# Patient Record
Sex: Female | Born: 1935 | Race: White | Hispanic: No | Smoking: Former smoker
Health system: Southern US, Community
[De-identification: ages and names within clinical notes are randomized; demographics above are authoritative.]

## PROBLEM LIST (undated history)

## (undated) DIAGNOSIS — F0390 Unspecified dementia without behavioral disturbance: Secondary | ICD-10-CM

## (undated) DIAGNOSIS — I10 Essential (primary) hypertension: Secondary | ICD-10-CM

## (undated) DIAGNOSIS — E7800 Pure hypercholesterolemia, unspecified: Secondary | ICD-10-CM

## (undated) HISTORY — PX: CATARACT EXTRACTION: SUR2

---

## 1959-04-30 HISTORY — PX: TOTAL ABDOMINAL HYSTERECTOMY: SHX209

## 2004-08-26 ENCOUNTER — Ambulatory Visit: Payer: Self-pay | Admitting: *Deleted

## 2004-09-02 ENCOUNTER — Ambulatory Visit: Payer: Self-pay | Admitting: Family Medicine

## 2004-11-22 ENCOUNTER — Ambulatory Visit: Payer: Self-pay | Admitting: Family Medicine

## 2005-03-02 ENCOUNTER — Ambulatory Visit: Payer: Self-pay | Admitting: Family Medicine

## 2005-06-07 ENCOUNTER — Ambulatory Visit: Payer: Self-pay | Admitting: Family Medicine

## 2005-10-17 ENCOUNTER — Ambulatory Visit: Payer: Self-pay | Admitting: Family Medicine

## 2006-02-15 ENCOUNTER — Ambulatory Visit: Payer: Self-pay | Admitting: Family Medicine

## 2006-04-28 ENCOUNTER — Ambulatory Visit (HOSPITAL_COMMUNITY): Admission: RE | Admit: 2006-04-28 | Discharge: 2006-04-28 | Payer: Self-pay | Admitting: Ophthalmology

## 2006-06-26 ENCOUNTER — Ambulatory Visit: Payer: Self-pay | Admitting: Family Medicine

## 2006-10-17 ENCOUNTER — Ambulatory Visit: Payer: Self-pay | Admitting: Family Medicine

## 2011-01-14 NOTE — Op Note (Signed)
NAMEVAUDINE, DUTAN                ACCOUNT NO.:  [id number]   MEDICAL RECORD NO.:  [id number]          PATIENT TYPE:  AMB   LOCATION:  DAY                           FACILITY:  [facility]   PHYSICIAN:  Susanne Greenhouse, MD       DATE OF BIRTH:  06-24-1924   DATE OF PROCEDURE:  04/28/2006  DATE OF DISCHARGE:  04/28/2006                                 OPERATIVE REPORT   PREOPERATIVE DIAGNOSIS:  Nuclear and cortical cataract, left eye.   POSTOPERATIVE DIAGNOSIS:  Nuclear and cortical cataract, left eye.   PROCEDURE:  Phacoemulsification with intraocular lens implantation, left  eye.   SURGEON:  Susanne Greenhouse, M.D.   ANESTHESIA:  Topical with monitored anesthesia care.   DESCRIPTION OF PROCEDURE:  In the preoperative area, dilating drops and 2%  viscous lidocaine were placed into the left eye.  The patient was then  brought to the operating room where the eye was prepped and draped.   A Supersharp blade was used to make a paracentesis port at the surgeon's 2  o'clock position.  The anterior chamber was filled with 1% nonpreserved  lidocaine solution followed by Amvisc Plus.  A 2.85-mm keratome blade was  used to make a clear corneal incision at the superotemporal limbus.  A  cystotome needle was used to create a continuous tear capsulotomy.  Hydrodissection was performed using balanced salt solution on a fine  cannula.  The lens nucleus was then removed using phacoemulsification with  the quadrant cracking technique.  The residual cortex was removed with the  irrigation and aspiration.  The capsular bag and anterior chamber were  refilled with Amvisc Plus.  A posterior chamber intraocular lens was placed  into the capsular bag using a Monarch lens injecting system.  The Amvisc  Plus was then removed from the capsular bag and anterior chamber with  irrigation and aspiration.  Stromal hydration of the main incision and  paracentesis ports were performed with balanced salt solution on a fine  cannula.  The wound was tested for leak which was negative.  There were no  operative complications.   The patient tolerated the procedure well and was returned to the recovery  area in satisfactory condition.   PROSTHETIC DEVICE:  Alcon AcrySof posterior chamber intraocular lens, model  SN60WF, power of 22.0, serial #[id number].           ______________________________  Susanne Greenhouse, MD     KEH/MEDQ  D:  05/29/2006  T:  05/30/2006  Job:  [id number]

## 2011-02-26 ENCOUNTER — Emergency Department (HOSPITAL_BASED_OUTPATIENT_CLINIC_OR_DEPARTMENT_OTHER)
Admission: EM | Admit: 2011-02-26 | Discharge: 2011-02-27 | Disposition: A | Discharge status: Home / Self Care | Payer: Medicare Other | Source: Home / Self Care | Attending: Emergency Medicine | Admitting: Emergency Medicine

## 2011-02-26 ENCOUNTER — Emergency Department (INDEPENDENT_AMBULATORY_CARE_PROVIDER_SITE_OTHER): Payer: Medicare Other

## 2011-02-26 DIAGNOSIS — F29 Unspecified psychosis not due to a substance or known physiological condition: Secondary | ICD-10-CM

## 2011-02-26 DIAGNOSIS — R5381 Other malaise: Secondary | ICD-10-CM

## 2011-02-26 DIAGNOSIS — R5383 Other fatigue: Secondary | ICD-10-CM

## 2011-02-26 LAB — DIFFERENTIAL
Basophils Absolute: 0 10*3/uL (ref 0.0–0.1)
Basophils Relative: 0 % (ref 0–1)
Eosinophils Absolute: 0 10*3/uL (ref 0.0–0.7)
Eosinophils Relative: 0 % (ref 0–5)
Monocytes Absolute: 0.8 10*3/uL (ref 0.1–1.0)
Neutro Abs: 15.5 10*3/uL — ABNORMAL HIGH (ref 1.7–7.7)

## 2011-02-26 LAB — URINALYSIS, ROUTINE W REFLEX MICROSCOPIC
Nitrite: NEGATIVE
Protein, ur: 30 mg/dL — AB
Urobilinogen, UA: 1 mg/dL (ref 0.0–1.0)

## 2011-02-26 LAB — CBC
Hemoglobin: 11.7 g/dL — ABNORMAL LOW (ref 12.0–15.0)
MCHC: 34.4 g/dL (ref 30.0–36.0)
Platelets: 209 10*3/uL (ref 150–400)
RDW: 12.7 % (ref 11.5–15.5)

## 2011-02-26 LAB — URINE MICROSCOPIC-ADD ON

## 2011-02-26 LAB — COMPREHENSIVE METABOLIC PANEL
AST: 24 U/L (ref 0–37)
Alkaline Phosphatase: 48 U/L (ref 39–117)
BUN: 34 mg/dL — ABNORMAL HIGH (ref 6–23)
CO2: 21 mEq/L (ref 19–32)
Chloride: 97 mEq/L (ref 96–112)
Creatinine, Ser: 1.3 mg/dL — ABNORMAL HIGH (ref 0.50–1.10)
GFR calc non Af Amer: 39 mL/min — ABNORMAL LOW (ref >60)
Potassium: 2.5 mEq/L — CL (ref 3.5–5.1)
Total Bilirubin: 1 mg/dL (ref 0.3–1.2)

## 2011-02-26 LAB — PROCALCITONIN: Procalcitonin: 0.34 ng/mL

## 2011-02-27 ENCOUNTER — Inpatient Hospital Stay (HOSPITAL_COMMUNITY)
Admission: EM | Admit: 2011-02-27 | Discharge: 2011-03-01 | DRG: 923 | Disposition: A | Discharge status: Home Health | Payer: Medicare Other | Source: Other Acute Inpatient Hospital | Attending: Family Medicine | Admitting: Family Medicine

## 2011-02-27 ENCOUNTER — Inpatient Hospital Stay (HOSPITAL_COMMUNITY): Payer: Medicare Other

## 2011-02-27 DIAGNOSIS — Z23 Encounter for immunization: Secondary | ICD-10-CM

## 2011-02-27 DIAGNOSIS — R404 Transient alteration of awareness: Secondary | ICD-10-CM | POA: Diagnosis present

## 2011-02-27 DIAGNOSIS — T6701XA Heatstroke and sunstroke, initial encounter: Principal | ICD-10-CM | POA: Diagnosis present

## 2011-02-27 DIAGNOSIS — N289 Disorder of kidney and ureter, unspecified: Secondary | ICD-10-CM | POA: Diagnosis present

## 2011-02-27 DIAGNOSIS — D72829 Elevated white blood cell count, unspecified: Secondary | ICD-10-CM | POA: Diagnosis present

## 2011-02-27 DIAGNOSIS — F0390 Unspecified dementia without behavioral disturbance: Secondary | ICD-10-CM | POA: Diagnosis present

## 2011-02-27 DIAGNOSIS — E785 Hyperlipidemia, unspecified: Secondary | ICD-10-CM | POA: Diagnosis present

## 2011-02-27 DIAGNOSIS — E876 Hypokalemia: Secondary | ICD-10-CM | POA: Diagnosis present

## 2011-02-27 DIAGNOSIS — N390 Urinary tract infection, site not specified: Secondary | ICD-10-CM | POA: Diagnosis present

## 2011-02-27 DIAGNOSIS — I4891 Unspecified atrial fibrillation: Secondary | ICD-10-CM | POA: Diagnosis present

## 2011-02-27 DIAGNOSIS — N179 Acute kidney failure, unspecified: Secondary | ICD-10-CM | POA: Diagnosis present

## 2011-02-27 DIAGNOSIS — Z7982 Long term (current) use of aspirin: Secondary | ICD-10-CM

## 2011-02-27 DIAGNOSIS — I10 Essential (primary) hypertension: Secondary | ICD-10-CM | POA: Diagnosis present

## 2011-02-27 DIAGNOSIS — X30XXXA Exposure to excessive natural heat, initial encounter: Secondary | ICD-10-CM | POA: Diagnosis present

## 2011-02-27 DIAGNOSIS — E860 Dehydration: Secondary | ICD-10-CM | POA: Diagnosis present

## 2011-02-27 LAB — COMPREHENSIVE METABOLIC PANEL
AST: 37 U/L (ref 0–37)
Albumin: 3.1 g/dL — ABNORMAL LOW (ref 3.5–5.2)
BUN: 27 mg/dL — ABNORMAL HIGH (ref 6–23)
Calcium: 8.2 mg/dL — ABNORMAL LOW (ref 8.4–10.5)
Chloride: 106 mEq/L (ref 96–112)
Creatinine, Ser: 0.87 mg/dL (ref 0.50–1.10)
Total Bilirubin: 0.9 mg/dL (ref 0.3–1.2)
Total Protein: 5.3 g/dL — ABNORMAL LOW (ref 6.0–8.3)

## 2011-02-27 LAB — URINALYSIS, ROUTINE W REFLEX MICROSCOPIC
Hgb urine dipstick: NEGATIVE
Leukocytes, UA: NEGATIVE
Nitrite: NEGATIVE
Protein, ur: NEGATIVE mg/dL
Specific Gravity, Urine: 1.004 — ABNORMAL LOW (ref 1.005–1.030)
Urobilinogen, UA: 0.2 mg/dL (ref 0.0–1.0)

## 2011-02-27 LAB — CBC
HCT: 30.2 % — ABNORMAL LOW (ref 36.0–46.0)
MCHC: 34.4 g/dL (ref 30.0–36.0)
MCV: 87 fL (ref 78.0–100.0)
Platelets: 164 10*3/uL (ref 150–400)
RDW: 12.9 % (ref 11.5–15.5)
WBC: 8.6 10*3/uL (ref 4.0–10.5)

## 2011-02-27 LAB — DIFFERENTIAL
Eosinophils Absolute: 0 10*3/uL (ref 0.0–0.7)
Eosinophils Relative: 0 % (ref 0–5)
Lymphs Abs: 1.5 10*3/uL (ref 0.7–4.0)
Monocytes Absolute: 1 10*3/uL (ref 0.1–1.0)

## 2011-02-27 LAB — MAGNESIUM: Magnesium: 1.7 mg/dL (ref 1.5–2.5)

## 2011-02-27 LAB — TSH: TSH: 0.424 u[IU]/mL (ref 0.350–4.500)

## 2011-02-27 LAB — CK: Total CK: 905 U/L — ABNORMAL HIGH (ref 7–177)

## 2011-02-27 LAB — MRSA PCR SCREENING: MRSA by PCR: NEGATIVE

## 2011-02-28 LAB — URINE CULTURE
Culture  Setup Time: 201207012053
Culture: NO GROWTH

## 2011-02-28 LAB — CBC
HCT: 37.2 % (ref 36.0–46.0)
MCHC: 35.2 g/dL (ref 30.0–36.0)
Platelets: 190 10*3/uL (ref 150–400)
RDW: 12.8 % (ref 11.5–15.5)
WBC: 13.5 10*3/uL — ABNORMAL HIGH (ref 4.0–10.5)

## 2011-02-28 LAB — BASIC METABOLIC PANEL
BUN: 13 mg/dL (ref 6–23)
Chloride: 107 mEq/L (ref 96–112)
Creatinine, Ser: 0.57 mg/dL (ref 0.50–1.10)
GFR calc Af Amer: >60 mL/min (ref >60)
GFR calc non Af Amer: >60 mL/min (ref >60)
Potassium: 3 mEq/L — ABNORMAL LOW (ref 3.5–5.1)

## 2011-02-28 LAB — IRON AND TIBC: Iron: 18 ug/dL — ABNORMAL LOW (ref 42–135)

## 2011-02-28 LAB — CK: Total CK: 928 U/L — ABNORMAL HIGH (ref 7–177)

## 2011-02-28 LAB — MYOGLOBIN, SERUM: Myoglobin: 618 ng/mL — ABNORMAL HIGH (ref <111)

## 2011-02-28 LAB — VITAMIN B12: Vitamin B-12: 448 pg/mL (ref 211–911)

## 2011-03-01 ENCOUNTER — Inpatient Hospital Stay (HOSPITAL_COMMUNITY): Payer: Medicare Other

## 2011-03-01 LAB — CBC
MCHC: 35 g/dL (ref 30.0–36.0)
Platelets: 189 10*3/uL (ref 150–400)
RDW: 13.2 % (ref 11.5–15.5)

## 2011-03-01 LAB — CK: Total CK: 536 U/L — ABNORMAL HIGH (ref 7–177)

## 2011-03-01 LAB — BASIC METABOLIC PANEL
Calcium: 8.5 mg/dL (ref 8.4–10.5)
GFR calc non Af Amer: >60 mL/min (ref >60)
Sodium: 136 mEq/L (ref 135–145)

## 2011-03-01 LAB — AMMONIA: Ammonia: 15 umol/L (ref 11–60)

## 2011-03-01 LAB — RPR: RPR Ser Ql: NONREACTIVE

## 2011-03-05 LAB — CULTURE, BLOOD (ROUTINE X 2): Culture: NO GROWTH

## 2011-03-10 NOTE — Discharge Summary (Signed)
NAMEMarland Kitchen  NARDOS, PUTNAM NO.:  [id number]  MEDICAL RECORD NO.:  [id number]  LOCATION:  2035                         FACILITY:  [facility]  PHYSICIAN:  Brendia Sacks, MD    DATE OF BIRTH:  15-Aug-1924  DATE OF ADMISSION:  02/27/2011 DATE OF DISCHARGE:  03/01/2011                              DISCHARGE SUMMARY   PRIMARY CARE PHYSICIAN:  Delaney Meigs, MD  DISPOSITION:  Home with Home Health, physical and occupational therapy, as well as home health RN.  DISCHARGE DIAGNOSES: 1. Hyperthermia/heat exhaustion. 2. Acute delirium, improving. 3. Hypokalemia, resolved. 4. Acute renal failure, resolved. 5. Possible brief atrial fibrillation, resolved.  HISTORY OF PRESENT ILLNESS:  This is an 77 year old woman who presented to the emergency room after her granddaughter found her unconscious. The patient's air-conditioning had been out for 2 or 3 days and the granddaughter went to check on her and found her unconscious.  She was taken to [facility] where she was found to have a temperature of 104.5 degrees rectally.  She was given IV fluids and cooled down.  Her hypokalemia was repleted and she was admitted to [facility] for further evaluation and treatment.  HOSPITAL COURSE: 1. Hyperthermia.  The patient was cool, was brought to [facility].     She has been treated with supportive care.  Temperature has     normalized and she is much improved at this point.  The underlying     etiology would be the extreme heat outside with a broken air     conditioning unit. 2. Acute delirium.  This is improving.  It is likely secondary to mild     underlying dementia.  By history, the patient does get confused     when her environment changes.  She has been seen by Physical and     Occupational Therapy and is felt to be able to go home with 24-hour     assistance.  I have discussed the case with her granddaughter by     telephone today whose name is [employer] and  she will be able to     stay with her grandmother 24 x 7 for at least the next few days to     make she gets acclimated. 3. Hypokalemia.  This was repleted. 4. Acute renal failure secondary to the heat and insensible losses.     This has resolved with IV fluids.  Note that the patient's total CK     is trending downwards towards normal. 5. Possible brief atrial fibrillation.  This is documented in the     history and physical and in the emergency room department note,     however, I can find no EKG with atrial fibrillation on it.  EKG     that is available for review is dated February 27, 2011 at 6:24 a.m.     which shows sinus rhythm with PAC.  No acute changes are seen     there.  Regardless, even if the patient did demonstrate atrial     fibrillation, this would be in the context of hyperthermia,     dehydration, and presumably cardiac  irritability.  At this point, I     would not recommend any further evaluation and as correction of her     hypothermia has resolved her condition.  I would not put her on any     rate control agents at this point.  She is not on aspirin.  Low-     dose baby aspirin would be reasonable in this patient in general.     This can certainly be started on discharge and I would defer     continuing it to her primary care physician.  At this point,     because of her recent renal failure, I have put her     hydrochlorothiazide and Lipitor on hold.  The hydrochlorothiazide     likely contributed to her renal failure.  CONSULTATIONS:  None.  PROCEDURES:  None.  IMAGING: 1. Chest x-ray, February 26, 2011:  Minimal bronchitic and emphysematous     changes.  No definite acute process. 2. CT of the head, February 27, 2011:  Mild scattered white matter disease     likely representing small vessel ischemic change.  No acute     intracranial finding demonstrated. 3. Chest x-ray, March 01, 2011:  Emphysematous and bronchitic changes     with minimal bibasilar  atelectasis.  MICROBIOLOGY: 1. Blood cultures x2, March 28, 2011, no growth to date. 2. Urine culture, February 27, 2011, no growth until the final.  ANCILLARY STUDIES:  EKG as described above.  PERTINENT LABORATORY STUDIES:  Admission CBC notable for a leukocytosis of 16.9 which decreased to 11.0 on discharge.  Hemoglobin, hematocrit, and platelet count within normal limits.  Basic metabolic panel notable for potassium of 2.5 on admission and 4.7 on discharge.  Creatinine on admission 1.309, on discharge 0.61.  Total creatinine kinase 905, on discharge 536.  Serum ammonia within normal limits.  TSH within normal limits.  PHYSICAL EXAMINATION ON DISCHARGE:  GENERAL:  The patient feels well. CARDIOVASCULAR:  Regular rate and rhythm.  No murmur, rub, or gallop. RESPIRATORY:  Clear to auscultation bilaterally.  No wheezes, rales, or rhonchi.  Normal respiratory effort.  No lower extremity edema. NEUROLOGIC:  Cranial nerves II-XII do appear to be intact.  Speech is fluent and clear.  She is alert and oriented to herself and to her location.  The history she relates does appear to be consistent with what the granddaughter has reported to me.  Tone and strength in the upper and lower extremities is 5/5 and symmetric.  No gross neurologic deficits.  DISCHARGE INSTRUCTIONS:  The patient will be discharged home today with 24-hour assistance provided by her granddaughter.  She will be discharged home with home health, physical, occupational therapy, and home health nurse as needed.  ACTIVITY:  Increase slowly.  Walk with assistance.  DIET:  Unrestricted.  FOLLOWUP:  With Dr. Joette Catching in 1 week.  DISCHARGE MEDICATIONS:  Aspirin 81 mg p.o. daily.  DISCONTINUE THE FOLLOWING MEDICATIONS:  Lipitor and hydrochlorothiazide. These could be resumed in the outpatient setting as per Dr. Joyce Copa discretion.  Time coordinating discharge is 25 minutes.     Brendia Sacks,  MD     DG/MEDQ  D:  03/01/2011  T:  03/02/2011  Job:  [id number]  cc:   Delaney Meigs, M.D.  Electronically Signed by Brendia Sacks  on 03/10/2011 11:21:27 AM

## 2011-03-14 NOTE — H&P (Signed)
Brandi Ramirez, Brandi Ramirez                ACCOUNT NO.:  [id number]  MEDICAL RECORD NO.:  [id number]  LOCATION:  3303                         FACILITY:  [facility]  PHYSICIAN:  Edmundo Tedesco, DO         DATE OF BIRTH:  03-03-1924  DATE OF ADMISSION:  02/27/2011 DATE OF DISCHARGE:                             HISTORY & PHYSICAL   CHIEF COMPLAINT:  Heat exhaustion.  HISTORY OF PRESENT ILLNESS:  The patient is an 77 year old female who states that her air conditioning has been out for 2-3 days.  She did have a repair person come by and they said that they would fix it in a couple of days.  She states that she did not feel especially hot but apparently her granddaughter tried to reach her this afternoon and was unable to reach her so she came by to see her and the patient was not conscious.  She was taken to [facility] and was found to have a temperature of 104.5 rectally.  There she was given IV fluids.  She was cooled down.  Her potassium was 2.5.  She was given 6 rounds of IV potassium and at the time that she left her temperature was 97.8. Here, the patient is awake, alert and states she feels quite a bit better.  PAST MEDICAL HISTORY:  Significant only for hypertension.  PAST SURGICAL HISTORY:  None.  MEDICATIONS:  The patient states she takes Lipitor and one other medication that she cannot remember the name of.  ALLERGIES:  NKDA.  FAMILY HISTORY:  She states that she has a large family and has little bit of everything in her family which includes coronary artery disease and cancer.  REVIEW OF SYSTEMS:  CONSTITUTIONAL:  Negative for fever, negative for chills.  Positive for weakness, positive for fatigue.  CNS:  Positive for loss of consciousness secondary to heat  exhaustion.  Negative for seizure, negative for  specific limb weakness.  ENT:  No nasal congestion or throat pain.  No coryza.  CARDIOVASCULAR:  No chest pain, no palpitations, no orthopnea.  RESPIRATORY:  No  cough, no shortness of breath, no wheezing.  GASTROINTESTINAL: No nausea, no vomiting, no constipation, no diarrhea.  GENITOURINARY:  No dysuria, no hematuria, no urinary frequency.  RENAL:  No flank pain.  No swelling.  No pruritus. SKIN:  No rashes or sores or lesions.  HEMATOLOGICAL:  No easy bruising, no purpura, no clots.  LYMPH:  No lymphadenopathy.  No painful nodes or specific lymph swelling.  PSYCHIATRIC:  No anxiety.  No depression.  No insomnia.  SOCIAL HISTORY:  No tobacco and alcohol.  No recreational drug use.  The patient lives at home alone but has family nearby.  PHYSICAL EXAMINATION:  VITAL SIGNS:  Temperature is 97.8, pulse 92, respiratory rate 19, blood pressure 90/47, O2 sat 100% on 2 liters of O2. GENERAL:  The patient is awake, alert and oriented x3.  She is a well kept, well-nourished elderly female who does seem to perseverate a little bit in her speech. HEENT:  Eyes, pupils equal, round and reactive to light and accommodation.  External ocular movements bilaterally intact.  Sclerae are nonicteric,  noninjected.  Mouth, oral mucosa dry.  No lesions.  No sores.  Pharynx clear.  No erythema or exudate. NECK:  Negative for JVD.  Negative for thyromegaly.  Negative lymphadenopathy. HEART:  Regular rate and rhythm at 90 beats per minute without murmur, ectopy, or gallops.  No lateral PMI.  No thrills. LUNGS:  Clear to auscultation bilaterally without wheezes, rales or rhonchi.  No increased work of breathing.  No tactile fremitus. ABDOMEN:  Soft, nontender, nondistended.  Positive bowel sounds.  No hepatosplenomegaly.  No hernias palpated. EXTREMITIES.  Negative for cyanosis, clubbing or edema.  Positive for slightly diminished dorsalis pedis and popliteal pulses bilaterally.  No carotid bruits bilaterally.  NEUROLOGICAL:  Cranial nerves II-XII grossly intact.  The patient is moving all extremities.  Motor and sensitivity is intact.  LABORATORY STUDIES:  WBC is  16.9, hemoglobin 11.7, hematocrit 34, platelets 209.  She has 92% neutrophils.  UA is weakly positive for UTI. Sodium 134, potassium 2.5, chloride 97, CO2 21, BUN 34, creatinine is 1.30, glucose 157.  Procalcitonin 0.34, lactic acid is 0.9.  Chest x-ray shows minimal bronchitic and emphysematous changes, otherwise, no acute disease.  EKG showed atrial fibrillation with nonspecific ST and T-wave changes, rate was 106.  She had no prior EKG for comparison.  ASSESSMENT: 1. Heat exhaustion. 2. Hypokalemia.  The patient was given 60 mEq of potassium chloride at     [facility]. 3. Dehydration. 4. Acute renal failure. 5. Atrial fibrillation, unknown if this is an old problem or not.  The     patient has a CHAD2 score of 1 making her stroke risk from atrial     fibrillation of 1.9-2.8%.  Recommendation is for an aspirin a day.     We will hydrate the patient and monitor her rate.  At this point     with her hypotension and dehydrated state, I would not want to     start her on a rate controlling agent as she may not need it and it     may actually make her blood pressure too low. 6. Mild urinary tract infection. 7. Hyperlipidemia.8. Likely mild dementia.  PLAN: 1. IV fluids. 2. Recheck a potassium. 3. P.o. Bactrim. 4. Continue Lipitor. 5. Aspirin daily and recheck an EKG in the morning and monitor Is and     Os and electrolyte.  I have spent 48 minutes on this admission.          ______________________________ Fran Lowes, DO     AS/MEDQ  D:  02/27/2011  T:  02/27/2011  Job:  [id number]  cc:   Delaney Meigs, M.D.  Electronically Signed by Fran Lowes DO on 03/14/2011 01:46:17 PM

## 2011-07-13 ENCOUNTER — Emergency Department (HOSPITAL_BASED_OUTPATIENT_CLINIC_OR_DEPARTMENT_OTHER)
Admission: EM | Admit: 2011-07-13 | Discharge: 2011-07-14 | Disposition: A | Discharge status: Home / Self Care | Payer: Medicare Other | Attending: Emergency Medicine | Admitting: Emergency Medicine

## 2011-07-13 ENCOUNTER — Emergency Department (INDEPENDENT_AMBULATORY_CARE_PROVIDER_SITE_OTHER): Payer: Medicare Other

## 2011-07-13 ENCOUNTER — Encounter: Payer: Self-pay | Admitting: *Deleted

## 2011-07-13 DIAGNOSIS — M25469 Effusion, unspecified knee: Secondary | ICD-10-CM

## 2011-07-13 DIAGNOSIS — M25476 Effusion, unspecified foot: Secondary | ICD-10-CM | POA: Insufficient documentation

## 2011-07-13 DIAGNOSIS — W08XXXA Fall from other furniture, initial encounter: Secondary | ICD-10-CM | POA: Insufficient documentation

## 2011-07-13 DIAGNOSIS — W19XXXA Unspecified fall, initial encounter: Secondary | ICD-10-CM

## 2011-07-13 DIAGNOSIS — M1710 Unilateral primary osteoarthritis, unspecified knee: Secondary | ICD-10-CM

## 2011-07-13 DIAGNOSIS — M25569 Pain in unspecified knee: Secondary | ICD-10-CM | POA: Insufficient documentation

## 2011-07-13 DIAGNOSIS — I10 Essential (primary) hypertension: Secondary | ICD-10-CM | POA: Insufficient documentation

## 2011-07-13 DIAGNOSIS — M25473 Effusion, unspecified ankle: Secondary | ICD-10-CM | POA: Insufficient documentation

## 2011-07-13 DIAGNOSIS — M1120 Other chondrocalcinosis, unspecified site: Secondary | ICD-10-CM | POA: Insufficient documentation

## 2011-07-13 HISTORY — DX: Essential (primary) hypertension: I10

## 2011-07-13 NOTE — ED Notes (Signed)
Family came to visit pt this evening and found her in floor. Pt states that she didn't fall but was just sitting there. Now c/o left knee pain with minimal swelling.

## 2011-07-14 NOTE — ED Provider Notes (Signed)
History     CSN: [id number] Arrival date & time: 07/13/2011 10:31 PM   First MD Initiated Contact with Patient 07/13/11 2238      Chief Complaint  Patient presents with  . Fall  . Knee Pain    (Consider location/radiation/quality/duration/timing/severity/associated sxs/prior treatment) Patient is a 77 y.o. female presenting with fall and knee pain. The history is provided by the patient and a relative.  Fall The accident occurred 1 to 2 hours ago. Incident: While sitting on the couch. She fell from a height of 1 to 2 ft. She landed on carpet. There was no blood loss. Point of impact: buttocks. Pain location: None. The pain is at a severity of 0/10. The pain is mild. She was not ambulatory at the scene. There was no entrapment after the fall. There was no alcohol use involved in the accident. Pertinent negatives include no visual change, no fever, no numbness, no abdominal pain, no bowel incontinence, no nausea, no vomiting, no hematuria, no headaches, no hearing loss, no loss of consciousness and no tingling. Exacerbated by: Nothing. She has tried nothing for the symptoms.  Knee Pain Pertinent negatives include no chest pain, no abdominal pain, no headaches and no shortness of breath.   Patient lives alone. Found sitting down on the floor next to the couch where she had slid off and was able to get up. She has very problems in many people looking in on her, but does not have any formal home health at home. Family noticed her left knee was swollen and brought in for evaluation. Patient denies any known trauma or injury to her knee. No fevers or abrasions or lacerations. No head injury or neck pain. No extremity injuries otherwise. Patient uses a walker and a cane at home Past Medical History  Diagnosis Date  . Hypertension     No past surgical history on file.  No family history on file.  History  Substance Use Topics  . Smoking status: Not on file  . Smokeless tobacco: Not on file    . Alcohol Use: No    OB History    Grav Para Term Preterm Abortions TAB SAB Ect Mult Living                  Review of Systems  Constitutional: Negative for fever and chills.  HENT: Negative for neck pain and neck stiffness.   Eyes: Negative for pain.  Respiratory: Negative for shortness of breath.   Cardiovascular: Negative for chest pain and palpitations.  Gastrointestinal: Negative for nausea, vomiting, abdominal pain and bowel incontinence.  Genitourinary: Negative for dysuria and hematuria.  Musculoskeletal: Negative for back pain.  Skin: Negative for rash.  Neurological: Negative for tingling, loss of consciousness, numbness and headaches.  All other systems reviewed and are negative.    Allergies  Review of patient's allergies indicates no known allergies.  Home Medications   Current Outpatient Rx  Name Route Sig Dispense Refill  . ASPIRIN EC 81 MG PO TBEC Oral Take 81 mg by mouth daily.      . ATORVASTATIN CALCIUM 10 MG PO TABS Oral Take 10 mg by mouth daily.      Marland Kitchen HYDROCHLOROTHIAZIDE 12.5 MG PO CAPS Oral Take 12.5 mg by mouth daily.        BP 123/72  Pulse 96  Temp(Src) 97.9 F (36.6 C) (Oral)  Resp 20  SpO2 99%  Physical Exam  Constitutional: She appears well-developed and well-nourished.  HENT:  Head: Normocephalic  and atraumatic.  Eyes: Conjunctivae and EOM are normal. Pupils are equal, round, and reactive to light.  Neck: Full passive range of motion without pain. Neck supple. No thyromegaly present.       No meningismus  Cardiovascular: Normal rate, regular rhythm, S1 normal, S2 normal and intact distal pulses.   Pulmonary/Chest: Effort normal and breath sounds normal.  Abdominal: Soft. Bowel sounds are normal. There is no tenderness. There is no CVA tenderness.  Musculoskeletal: Normal range of motion.       Mild swelling over left knee. No erythema or increased warmth. No calf tenderness or cords. Negative Homans sign. Distal neurovascular  intact.  Neurological: She is alert. She has normal strength and normal reflexes. No cranial nerve deficit or sensory deficit. She displays a negative Romberg sign. GCS eye subscore is 4. GCS verbal subscore is 5. GCS motor subscore is 6.       Normal Gait with assistance. Alert and oriented to self only. This is reported as baseline per family  Skin: Skin is warm and dry. No rash noted. No cyanosis. Nails show no clubbing.  Psychiatric: She has a normal mood and affect. Her speech is normal and behavior is normal.    ED Course  Procedures (including critical care time)  Labs Reviewed - No data to display Dg Knee Complete 4 Views Left  07/13/2011  *RADIOLOGY REPORT*  Clinical Data: Left knee pain; limited range of motion.  LEFT KNEE - COMPLETE 4+ VIEW  Comparison: None.  Findings: There is no evidence of fracture or dislocation.  Joint spaces are grossly preserved.  Chondrocalcinosis is noted. Marginal osteophytes are noted arising at all three compartments.  A small to moderate knee joint effusion is noted; associated calcification is seen above the patella.  An apparent loose body is noted posteriorly at the joint space.  Scattered vascular calcifications are noted.  IMPRESSION:  1.  No evidence of fracture or dislocation. 2.  Osteoarthritis noted at the left knee. 3.  Chondrocalcinosis seen. 4.  Small to moderate knee joint effusion, with associated calcification superior to the patella; loose body noted posteriorly at the joint space. 5.  Scattered vascular calcifications seen.  Original Report Authenticated By: Tonia Ghent, M.D.    After evaluation, consult case management to have home health assessment done. Family willing to stay with her tonight and agreeable to plan. They have already initiated the process of higher level of care.   MDM   No trauma by exam. No clinical infection. X-ray obtained and reviewed as above. Patient ambulates and denies any pain or trauma. She is baseline per  family and stable for discharge home       Sunnie Nielsen, MD 07/14/11 [phone]

## 2011-07-14 NOTE — ED Notes (Signed)
Call placed to SW and nurse case manager for home health assistance per MD order, awaiting return call.

## 2011-07-14 NOTE — ED Notes (Signed)
Pt assisted to bathroom. Pt was incontinent stool. Pt cleaned and fresh diaper placed.

## 2011-07-14 NOTE — ED Notes (Signed)
MD in to evaluate pt now.

## 2011-07-14 NOTE — ED Notes (Signed)
Spoke with case manager on call and faxed appropriate paper work to [facility] for follow up with pt.

## 2011-07-14 NOTE — ED Notes (Signed)
Pt ambulates with assistance. Pt states she has a cane at home.

## 2012-07-25 IMAGING — CR DG CHEST 1V PORT
1 series · 1 of 1 positions shown · non-contrast
Comparison: Portable exam 3718 hours without priors for comparison

CLINICAL DATA: Heat exhaustion, lethargy, confusion, dementia

PORTABLE CHEST - 1 VIEW

[view not recorded]
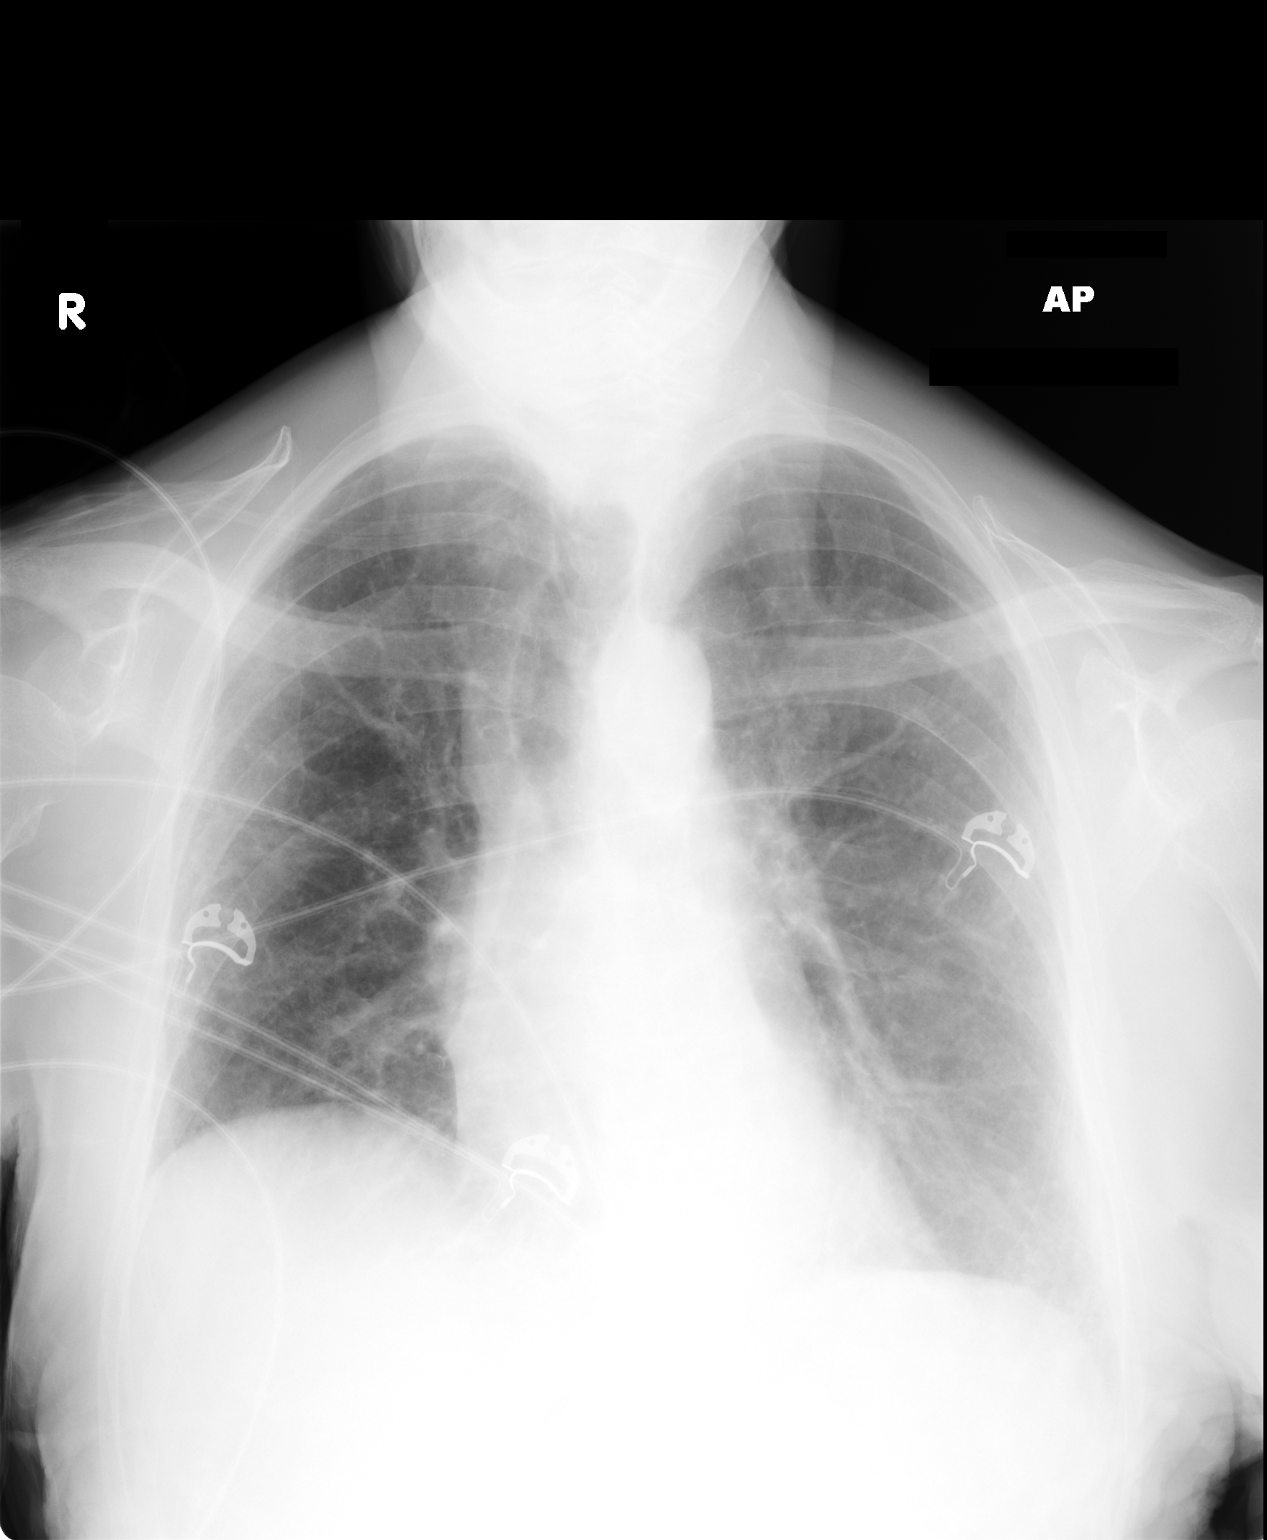

[1 of 1 positions shown; findings below may reference images not displayed]

FINDINGS: Normal heart size, mediastinal contours, and pulmonary vascularity.
Minimal hazy asymmetric density in left hemithorax, question
asymmetric emphysematous changes.
Minimal peribronchial thickening.
No gross infiltrate or effusion identified.
No pneumothorax.
Slightly rotated exam.
IMPRESSION: Minimal bronchitic and suspect emphysematous changes.
No definite acute process.

## 2013-08-26 ENCOUNTER — Inpatient Hospital Stay (HOSPITAL_COMMUNITY): Payer: Medicare Other

## 2013-08-26 ENCOUNTER — Emergency Department (HOSPITAL_COMMUNITY): Payer: Medicare Other

## 2013-08-26 ENCOUNTER — Encounter (HOSPITAL_COMMUNITY): Payer: Self-pay | Admitting: Emergency Medicine

## 2013-08-26 ENCOUNTER — Inpatient Hospital Stay (HOSPITAL_COMMUNITY)
Admission: EM | Admit: 2013-08-26 | Discharge: 2013-08-31 | DRG: 558 | Disposition: A | Discharge status: Skilled Nursing Facility | Payer: Medicare Other | Attending: Internal Medicine | Admitting: Internal Medicine

## 2013-08-26 DIAGNOSIS — R05 Cough: Secondary | ICD-10-CM | POA: Diagnosis present

## 2013-08-26 DIAGNOSIS — F0391 Unspecified dementia with behavioral disturbance: Secondary | ICD-10-CM | POA: Diagnosis present

## 2013-08-26 DIAGNOSIS — I10 Essential (primary) hypertension: Secondary | ICD-10-CM | POA: Diagnosis present

## 2013-08-26 DIAGNOSIS — F03918 Unspecified dementia, unspecified severity, with other behavioral disturbance: Secondary | ICD-10-CM

## 2013-08-26 DIAGNOSIS — Z79899 Other long term (current) drug therapy: Secondary | ICD-10-CM

## 2013-08-26 DIAGNOSIS — I4891 Unspecified atrial fibrillation: Secondary | ICD-10-CM

## 2013-08-26 DIAGNOSIS — M6282 Rhabdomyolysis: Principal | ICD-10-CM

## 2013-08-26 DIAGNOSIS — R058 Other specified cough: Secondary | ICD-10-CM

## 2013-08-26 DIAGNOSIS — W19XXXA Unspecified fall, initial encounter: Secondary | ICD-10-CM

## 2013-08-26 HISTORY — DX: Unspecified dementia, unspecified severity, without behavioral disturbance, psychotic disturbance, mood disturbance, and anxiety: F03.90

## 2013-08-26 HISTORY — DX: Pure hypercholesterolemia, unspecified: E78.00

## 2013-08-26 LAB — CK TOTAL AND CKMB (NOT AT ARMC)
Relative Index: 1.3 (ref 0.0–2.5)
Total CK: 957 U/L — ABNORMAL HIGH (ref 7–177)

## 2013-08-26 LAB — URINALYSIS, ROUTINE W REFLEX MICROSCOPIC
Bilirubin Urine: NEGATIVE
Hgb urine dipstick: NEGATIVE
Ketones, ur: 15 mg/dL — AB

## 2013-08-26 LAB — COMPREHENSIVE METABOLIC PANEL
Albumin: 3.6 g/dL (ref 3.5–5.2)
Alkaline Phosphatase: 79 U/L (ref 39–117)
BUN: 25 mg/dL — ABNORMAL HIGH (ref 6–23)
Calcium: 9.5 mg/dL (ref 8.4–10.5)
Potassium: 3.6 mEq/L (ref 3.5–5.1)
Sodium: 139 mEq/L (ref 135–145)
Total Protein: 7.2 g/dL (ref 6.0–8.3)

## 2013-08-26 LAB — POCT I-STAT TROPONIN I

## 2013-08-26 LAB — URINE MICROSCOPIC-ADD ON

## 2013-08-26 LAB — CBC
MCHC: 34 g/dL (ref 30.0–36.0)
RDW: 13.8 % (ref 11.5–15.5)

## 2013-08-26 MED ORDER — SODIUM CHLORIDE 0.9 % IV SOLN
INTRAVENOUS | Status: DC
  Administered 2013-08-27 (×4): via INTRAVENOUS

## 2013-08-26 MED ORDER — ONDANSETRON HCL 4 MG/2ML IJ SOLN: 4.0000 mg | Freq: Four times a day (QID) | INTRAMUSCULAR | Status: DC | PRN

## 2013-08-26 MED ORDER — ACETAMINOPHEN 650 MG RE SUPP: 650.0000 mg | Freq: Four times a day (QID) | RECTAL | Status: DC | PRN

## 2013-08-26 MED ORDER — ACETAMINOPHEN 325 MG PO TABS: 650.0000 mg | ORAL_TABLET | Freq: Four times a day (QID) | ORAL | Status: DC | PRN

## 2013-08-26 MED ORDER — ONDANSETRON HCL 4 MG PO TABS
4.0000 mg | ORAL_TABLET | Freq: Four times a day (QID) | ORAL | Status: DC | PRN
Start: 2013-08-26 — End: 2013-08-31

## 2013-08-26 MED ORDER — HEPARIN SODIUM (PORCINE) 5000 UNIT/ML IJ SOLN
5000.0000 [IU] | Freq: Three times a day (TID) | INTRAMUSCULAR | Status: DC
  Administered 2013-08-27 – 2013-08-31 (×14): 5000 [IU] via SUBCUTANEOUS
  Filled 2013-08-26 (×17): qty 1

## 2013-08-26 MED ORDER — SODIUM CHLORIDE 0.9 % IV BOLUS (SEPSIS)
1000.0000 mL | Freq: Once | INTRAVENOUS | Status: AC
  Administered 2013-08-26: 1000 mL via INTRAVENOUS

## 2013-08-26 MED ORDER — SODIUM CHLORIDE 0.9 % IJ SOLN
3.0000 mL | Freq: Two times a day (BID) | INTRAMUSCULAR | Status: DC
  Administered 2013-08-28 – 2013-08-29 (×2): 3 mL via INTRAVENOUS

## 2013-08-26 MED ORDER — SODIUM CHLORIDE 0.9 % IV SOLN: INTRAVENOUS | Status: DC

## 2013-08-26 NOTE — ED Notes (Signed)
Pt's family members at bedside. Offered case management consult r/t pt living alone (pt lives directly next to family and is checked on daily). Family states would like to speak with Case Manager. Burna Mortimer RN made aware and states will talk to pt and family.

## 2013-08-26 NOTE — ED Notes (Signed)
Dr. Jacubowitz at bedside 

## 2013-08-26 NOTE — ED Notes (Addendum)
Pt brought from home via [facility] with c/o fall with increased confusion. Pt was LSN by family at 2200 last night, pt was found by family at 1700 today on the floor with increased confusion (Pt has dementia and has some baseline confusion) Pt oriented to self. Pt c/o talbone pain and was immobilized upon arrival. Pt removed from LSB and head blocks by Dr. Ethelda Chick. Pt denies pain at this time.

## 2013-08-26 NOTE — ED Provider Notes (Signed)
CSN: [id number]     Arrival date & time 08/26/13  1837 History   First MD Initiated Contact with Patient 08/26/13 1936     Chief Complaint  Patient presents with  . Fall   (Consider location/radiation/quality/duration/timing/severity/associated sxs/prior Treatment) HPI Comments: Brandi Ramirez is a 79 year-old female with a past medical history of Dementia and HTN, presenting the Emergency Department with a chief complaint of unwitnessed fall.  The patient's daughter reports going over to her house for dinner, and finding the patient on the floor.  The patient lives alone, next door to family that check on her daily.  The pateint's daughter reports she found her laying on her back in her bedroom. Reports falling on to a carpeted floor, and reported pain in her left hip when trying to sit the patient up.  Level V caveat applies      The history is provided by the patient and a relative. No language interpreter was used.    Past Medical History  Diagnosis Date  . Hypertension   . High cholesterol     "took her off Lipitor ~ 6 months ago" (08/26/2013)  . Dementia    Past Surgical History  Procedure Laterality Date  . Total abdominal hysterectomy  1960's  . Cataract extraction Right ~ 2012   History reviewed. No pertinent family history. History  Substance Use Topics  . Smoking status: Former Smoker -- 0.50 packs/day    Types: Cigarettes  . Smokeless tobacco: Never Used     Comment: 08/26/2013 "social smoker; never inhaled; quit >45yr ago"  . Alcohol Use: No   OB History   Grav Para Term Preterm Abortions TAB SAB Ect Mult Living                 Review of Systems  Unable to perform ROS: Dementia    Allergies  Review of patient's allergies indicates no known allergies.  Home Medications   No current outpatient prescriptions on file. BP 121/72  Pulse 129  Temp(Src) 98.7 F (37.1 C) (Oral)  Resp 20  Ht 5\' 5"  (1.651 m)  Wt 117 lb 1 oz (53.1 kg)  BMI 19.48 kg/m2   SpO2 96% Physical Exam  Nursing note and vitals reviewed. Constitutional: She appears well-developed and well-nourished.  HENT:  Head: Normocephalic and atraumatic.  Eyes: Pupils are equal, round, and reactive to light.  Neck: Normal range of motion. Neck supple.  Cardiovascular: Normal rate.  An irregularly irregular rhythm present.  Pulmonary/Chest: Effort normal and breath sounds normal.  Abdominal: Soft. Normal appearance. There is no tenderness. There is no guarding.  Musculoskeletal:       Legs: LE: full ROM to bilateral knee and hips, no obvious deformity. Small abrasion to left knee.  Non-tender to palpation.   UE: Full ROM to elbows and shoulders, no obvious deformity. Bilateral elbows with abrasions.  Neurological: She is alert. She is disoriented. No sensory deficit. She exhibits normal muscle tone. Coordination normal.  Oriented to person and place not date/year.  Normal finger-nose finger.   Skin: Skin is warm and dry.  Psychiatric: Her speech is normal.    ED Course  Procedures (including critical care time) Labs Review Labs Reviewed  COMPREHENSIVE METABOLIC PANEL - Abnormal; Notable for the following:    BUN 25 (*)    GFR calc non Af Amer 79 (*)    All other components within normal limits  CK TOTAL AND CKMB - Abnormal; Notable for the following:  Total CK 957 (*)    CK, MB 12.1 (*)    All other components within normal limits  URINALYSIS, ROUTINE W REFLEX MICROSCOPIC - Abnormal; Notable for the following:    APPearance CLOUDY (*)    Ketones, ur 15 (*)    Leukocytes, UA SMALL (*)    All other components within normal limits  CBC  URINE MICROSCOPIC-ADD ON  COMPREHENSIVE METABOLIC PANEL  CBC  PROTIME-INR  CK  TROPONIN I  TROPONIN I  TROPONIN I  POCT I-STAT TROPONIN I   Imaging Review Dg Hip Complete Right  08/26/2013   CLINICAL DATA:  Patient fell.  Right hip pain.  EXAM: RIGHT HIP - COMPLETE 2+ VIEW  COMPARISON:  None.  FINDINGS: March degenerative  changes in both hips, greater on the right. There is sclerosis, subcortical cyst formation, loss of joint space, and hypertrophic changes on both sides of the joints bilaterally. Deformity of the superior and lateral femoral heads bilaterally. Changes probably represent marked osteoarthritis but superimposed avascular necrosis is not excluded. No evidence of acute fracture or dislocation of the pelvis or hips. Degenerative changes in the lower lumbar spine. Postoperative changes.  IMPRESSION: March degenerative changes in the hips. No acute displaced fractures.   Electronically Signed   By: Burman Nieves M.D.   On: 08/26/2013 21:24   Ct Head Wo Contrast  08/26/2013   CLINICAL DATA:  Post fall, now with increased confusion  EXAM: CT HEAD WITHOUT CONTRAST  TECHNIQUE: Contiguous axial images were obtained from the base of the skull through the vertex without intravenous contrast.  COMPARISON:  02/27/2011  FINDINGS: Persistent findings of atrophy with diffuse sulcal prominence and centralized volume loss with commensurate ex vacuo dilatation of the ventricular system. Grossly unchanged scattered periventricular hypodensities compatible with microvascular ischemic disease. Right-sided basal ganglial calcifications. Given extensive background parenchymal abnormalities, there is no CT evidence for acute large territory infarct. No definite intraparenchymal extra-axial mass or hemorrhage. Unchanged size and configuration of the ventricles and basilar cisterns. Intracranial atherosclerosis. Limited visualization of the paranasal sinuses and mastoid air cells are normal. Regional soft tissues are normal. No displaced calvarial fracture.  IMPRESSION: Persistent findings of atrophy and microvascular ischemic disease without acute intracranial process.   Electronically Signed   By: Simonne Come M.D.   On: 08/26/2013 21:18   Dg Chest Port 1 View  08/27/2013   CLINICAL DATA:  Crackles and dry cough.  EXAM: PORTABLE CHEST  - 1 VIEW  COMPARISON:  03/01/2011  FINDINGS: Shallow inspiration with elevation of the right hemidiaphragm. No focal consolidation in the lungs. Normal heart size and pulmonary vascularity. Peribronchial thickening and diffuse fibrosis in the lungs consistent with chronic bronchitic changes. No blunting of costophrenic angles. No pneumothorax. Similar appearance to previous study.  IMPRESSION: Shallow inspiration with elevated right hemidiaphragm. Fibrosis and chronic bronchitic changes in the lungs. No active disease. No significant change.   Electronically Signed   By: Burman Nieves M.D.   On: 08/27/2013 00:29    EKG: Date: 08/27/2013  Rate: 110  Rhythm: atrial fibrillation  QRS Axis: normal  Intervals: normal  ST/T Wave abnormalities: nonspecific T wave changes  Conduction Disutrbances:none  Narrative Interpretation:  Old EKG Reviewed: none available   MDM   1. Rhabdomyolysis   2. Fall, initial encounter   3. A-fib    Pt with an unwitnessed fall and has decrease in mental status per family, unknown time down.  No obvious fracture on exam.  Head and neck non-tender.  A-fib on first exam without a history.  Labs, imaging ordered. Concerned for Rhabdomyolysis. Discussed patient history, condition, and labs with Dr. Ethelda Chick.  After his evaluation of the patient advises ordering a total CK.  Re-eval: patient found to be in A-fib at a rate in the 120's, EKG ordered A-fib confirmed. CK, total 957, will hydrate the patient. Reports understanding and no other concerns at this time. Discussed patient history, condition, and labs with Dr. Allena Katz who will admit the patient for further evaluation. Discussed lab results, imaging results, and treatment plan which includes admission with the patient and family.        Clabe Seal, PA-C 08/27/13 [phone]

## 2013-08-26 NOTE — ED Notes (Addendum)
Maggie from Lab reports critical MB: 12.1  Lauren PA aware

## 2013-08-26 NOTE — H&P (Signed)
[facility] History and Physical  Patient: Brandi Ramirez  ZOX:[id number]  DOB: Apr 17, 1924  DOS: the patient was seen and examined on 08/26/2013 PCP: No primary provider on file.  Chief Complaint: fall  HPI: Brandi Ramirez is a 79 y.o. female with Past medical history of hypertension and dementia. The patient is coming from home. The patient is presenting daily as she was found to have an unwitnessed fall. The patient lives alone next to her family. And the family has seen the patient yesterday at dinnertime. Around the morning when the daughter was bringing the patient's paper she called up her walking in the house but at around often on the lunchtime when she went to check on her she found the patient lying on the ground on the carpeted floor with complaining of hip pain and inability to stand up. The patient has dementia therefore does not remember how she had a fall but she was aware of her surroundings as well as her daughter. There was no incontinence noted. The patient denies any focal neurological deficit. There is some confusion which is more than her baseline as per the daughter although she is able to identify where she is as well as identify the family members. There is no fever or chills or diarrhea or burning urination noted by the daughter.  Review of Systems: as mentioned in the history of present illness.  A Comprehensive review of the other systems is negative.  Past Medical History  Diagnosis Date  . Hypertension   . Dementia    No past surgical history on file. Social History:  reports that she has quit smoking. She does not have any smokeless tobacco history on file. She reports that she does not drink alcohol or use illicit drugs. Partially Independent for most of her  ADL.  No Known Allergies  No family history on file.  Prior to Admission medications   Medication Sig Start Date End Date Taking? Authorizing Provider  Multiple Vitamin (MULTIVITAMIN WITH  MINERALS) TABS tablet Take 1 tablet by mouth daily.   Yes Historical Provider, MD    Physical Exam: Filed Vitals:   08/26/13 2115 08/26/13 2141 08/26/13 2200 08/26/13 2230  BP: 124/76 110/65 136/70 128/60  Pulse: 99 123 99 86  Temp:      Resp: 22 22 19 19   SpO2: 97% 95% 97% 97%    General: Alert, Awake and Oriented to Time, Place and Person. Appear in moderate distress Eyes: PERRL ENT: Oral Mucosa clear moist. Neck: No JVD Cardiovascular: S1 and S2 Present,  Aortic systolic Murmur, Peripheral Pulses Present Respiratory: Bilateral Air entry equal and Decreased, bilateral basal Crackles, no wheezes Abdomen: Bowel Sound Present, Soft and Non tender Skin: No Rash Extremities: No Pedal edema, no calf tenderness Neurologic: Grossly Unremarkable.  Labs on Admission:  CBC:  Recent Labs Lab 08/26/13 2021  WBC 8.2  HGB 14.6  HCT 43.0  MCV 87.0  PLT 208    CMP     Component Value Date/Time   NA 139 08/26/2013 2021   K 3.6 08/26/2013 2021   CL 102 08/26/2013 2021   CO2 26 08/26/2013 2021   GLUCOSE 98 08/26/2013 2021   BUN 25* 08/26/2013 2021   CREATININE 0.61 08/26/2013 2021   CALCIUM 9.5 08/26/2013 2021   PROT 7.2 08/26/2013 2021   ALBUMIN 3.6 08/26/2013 2021   AST 36 08/26/2013 2021   ALT 19 08/26/2013 2021   ALKPHOS 79 08/26/2013 2021   BILITOT 1.0 08/26/2013 2021  GFRNONAA 79* 08/26/2013 2021   GFRAA >90 08/26/2013 2021    No results found for this basename: LIPASE, AMYLASE,  in the last 168 hours No results found for this basename: AMMONIA,  in the last 168 hours   Recent Labs Lab 08/26/13 2022  CKTOTAL 957*  CKMB 12.1*   BNP (last 3 results) No results found for this basename: PROBNP,  in the last 8760 hours  Radiological Exams on Admission: Dg Hip Complete Right  08/26/2013   CLINICAL DATA:  Patient fell.  Right hip pain.  EXAM: RIGHT HIP - COMPLETE 2+ VIEW  COMPARISON:  None.  FINDINGS: March degenerative changes in both hips, greater on the  right. There is sclerosis, subcortical cyst formation, loss of joint space, and hypertrophic changes on both sides of the joints bilaterally. Deformity of the superior and lateral femoral heads bilaterally. Changes probably represent marked osteoarthritis but superimposed avascular necrosis is not excluded. No evidence of acute fracture or dislocation of the pelvis or hips. Degenerative changes in the lower lumbar spine. Postoperative changes.  IMPRESSION: March degenerative changes in the hips. No acute displaced fractures.   Electronically Signed   By: Burman Nieves M.D.   On: 08/26/2013 21:24   Ct Head Wo Contrast  08/26/2013   CLINICAL DATA:  Post fall, now with increased confusion  EXAM: CT HEAD WITHOUT CONTRAST  TECHNIQUE: Contiguous axial images were obtained from the base of the skull through the vertex without intravenous contrast.  COMPARISON:  02/27/2011  FINDINGS: Persistent findings of atrophy with diffuse sulcal prominence and centralized volume loss with commensurate ex vacuo dilatation of the ventricular system. Grossly unchanged scattered periventricular hypodensities compatible with microvascular ischemic disease. Right-sided basal ganglial calcifications. Given extensive background parenchymal abnormalities, there is no CT evidence for acute large territory infarct. No definite intraparenchymal extra-axial mass or hemorrhage. Unchanged size and configuration of the ventricles and basilar cisterns. Intracranial atherosclerosis. Limited visualization of the paranasal sinuses and mastoid air cells are normal. Regional soft tissues are normal. No displaced calvarial fracture.  IMPRESSION: Persistent findings of atrophy and microvascular ischemic disease without acute intracranial process.   Electronically Signed   By: Simonne Come M.D.   On: 08/26/2013 21:18    EKG: Independently reviewed. normal sinus rhythm, nonspecific ST and T waves changes.  Assessment/Plan Principal Problem:    Rhabdomyolysis Active Problems:   Dry cough   1. Rhabdomyolysis The patient is presenting with a fall which was not witnessed. As per the family there was no incontinence but there was some confusion. As per the family there was no other trauma noted. And her house was in proper order. She might have a fall around early morning as the heart or walking in the house earlier in the morning but they haven't seen any other regular morning activities that she does on a regular rate basis. Her CPK is elevated Although I do not see any redness or warmth on her lower extremity or upper extremity Her x-ray of the hip as well as CT head does not show any acute abnormality B. continue her on IV hydration and follow her CPK in the morning  2. Dry cough I will check an x-ray of the chest for further clarification. Her examination sounds like she has fibrosis chronic lungs. Does not appear to be in acute respiratory failure or inflammatory changes suggesting pneumonia or bronchitis.  DVT Prophylaxis: subcutaneous Heparin Nutrition: Advance as tolerated  Code Status: Full  Family Communication: Family was present at bedside,  opportunity was given to ask question and all questions were answered satisfactorily at the time of interview. Disposition: Admitted to inpatient in telemetry unit.  Author: Lynden Oxford, MD [facility] Pager: [phone] 08/26/2013, 11:21 PM    If 7PM-7AM, please contact night-coverage [email or web address] Password [email or web address]

## 2013-08-26 NOTE — ED Notes (Signed)
Lauren PA at bedside

## 2013-08-26 NOTE — ED Provider Notes (Addendum)
Level V caveat dementia Patient was found on the ground by her daughter today approximately 5 PM. She may have fallen as latest 10 PM last night. She is presently asymptomatic. Denies pain anywhere. On exam pleasant alert confused cranial nerves II through XII intact moves all extremities well. Lab work consistent with mild rhabdomyolysis. Results for orders placed during the hospital encounter of 08/26/13  CBC      Result Value Range   WBC 8.2  4.0 - 10.5 K/uL   RBC 4.94  3.87 - 5.11 MIL/uL   Hemoglobin 14.6  12.0 - 15.0 g/dL   HCT 16.1  09.6 - 04.5 %   MCV 87.0  78.0 - 100.0 fL   MCH 29.6  26.0 - 34.0 pg   MCHC 34.0  30.0 - 36.0 g/dL   RDW 40.9  81.1 - 91.4 %   Platelets 208  150 - 400 K/uL  COMPREHENSIVE METABOLIC PANEL      Result Value Range   Sodium 139  135 - 145 mEq/L   Potassium 3.6  3.5 - 5.1 mEq/L   Chloride 102  96 - 112 mEq/L   CO2 26  19 - 32 mEq/L   Glucose, Bld 98  70 - 99 mg/dL   BUN 25 (*) 6 - 23 mg/dL   Creatinine, Ser 7.82  0.50 - 1.10 mg/dL   Calcium 9.5  8.4 - 95.6 mg/dL   Total Protein 7.2  6.0 - 8.3 g/dL   Albumin 3.6  3.5 - 5.2 g/dL   AST 36  0 - 37 U/L   ALT 19  0 - 35 U/L   Alkaline Phosphatase 79  39 - 117 U/L   Total Bilirubin 1.0  0.3 - 1.2 mg/dL   GFR calc non Af Amer 79 (*) >90 mL/min   GFR calc Af Amer >90  >90 mL/min  CK TOTAL AND CKMB      Result Value Range   Total CK 957 (*) 7 - 177 U/L   CK, MB 12.1 (*) 0.3 - 4.0 ng/mL   Relative Index 1.3  0.0 - 2.5  URINALYSIS, ROUTINE W REFLEX MICROSCOPIC      Result Value Range   Color, Urine YELLOW  YELLOW   APPearance CLOUDY (*) CLEAR   Specific Gravity, Urine 1.016  1.005 - 1.030   pH 7.5  5.0 - 8.0   Glucose, UA NEGATIVE  NEGATIVE mg/dL   Hgb urine dipstick NEGATIVE  NEGATIVE   Bilirubin Urine NEGATIVE  NEGATIVE   Ketones, ur 15 (*) NEGATIVE mg/dL   Protein, ur NEGATIVE  NEGATIVE mg/dL   Urobilinogen, UA 0.2  0.0 - 1.0 mg/dL   Nitrite NEGATIVE  NEGATIVE   Leukocytes, UA SMALL (*)  NEGATIVE  URINE MICROSCOPIC-ADD ON      Result Value Range   Squamous Epithelial / LPF RARE  RARE   WBC, UA 0-2  <3 WBC/hpf   Bacteria, UA RARE  RARE   Urine-Other AMORPHOUS URATES/PHOSPHATES    POCT I-STAT TROPONIN I      Result Value Range   Troponin i, poc 0.03  0.00 - 0.08 ng/mL   Comment 3            Dg Hip Complete Right  08/26/2013   CLINICAL DATA:  Patient fell.  Right hip pain.  EXAM: RIGHT HIP - COMPLETE 2+ VIEW  COMPARISON:  None.  FINDINGS: March degenerative changes in both hips, greater on the right. There is sclerosis, subcortical cyst formation, loss  of joint space, and hypertrophic changes on both sides of the joints bilaterally. Deformity of the superior and lateral femoral heads bilaterally. Changes probably represent marked osteoarthritis but superimposed avascular necrosis is not excluded. No evidence of acute fracture or dislocation of the pelvis or hips. Degenerative changes in the lower lumbar spine. Postoperative changes.  IMPRESSION: March degenerative changes in the hips. No acute displaced fractures.   Electronically Signed   By: Burman Nieves M.D.   On: 08/26/2013 21:24   Ct Head Wo Contrast  08/26/2013   CLINICAL DATA:  Post fall, now with increased confusion  EXAM: CT HEAD WITHOUT CONTRAST  TECHNIQUE: Contiguous axial images were obtained from the base of the skull through the vertex without intravenous contrast.  COMPARISON:  02/27/2011  FINDINGS: Persistent findings of atrophy with diffuse sulcal prominence and centralized volume loss with commensurate ex vacuo dilatation of the ventricular system. Grossly unchanged scattered periventricular hypodensities compatible with microvascular ischemic disease. Right-sided basal ganglial calcifications. Given extensive background parenchymal abnormalities, there is no CT evidence for acute large territory infarct. No definite intraparenchymal extra-axial mass or hemorrhage. Unchanged size and configuration of the  ventricles and basilar cisterns. Intracranial atherosclerosis. Limited visualization of the paranasal sinuses and mastoid air cells are normal. Regional soft tissues are normal. No displaced calvarial fracture.  IMPRESSION: Persistent findings of atrophy and microvascular ischemic disease without acute intracranial process.   Electronically Signed   By: Simonne Come M.D.   On: 08/26/2013 21:18    Date: 08/27/2013  Rate: 110  Rhythm: atrial fibrillation  QRS Axis: normal  Intervals: normal  ST/T Wave abnormalities: nonspecific T wave changes  Conduction Disutrbances:none  Narrative Interpretation:   Old EKG Reviewed: none available   Doug Sou, MD 08/26/13 2201  Doug Sou, MD 08/27/13 0454

## 2013-08-26 NOTE — Progress Notes (Signed)
Called ED for report. RN busy with another pt and stated she will call back.

## 2013-08-26 NOTE — Progress Notes (Signed)
ED CM noted patient s/p fall. Pt presented to [facility] ED s/p unwitnessed fall with confusion. In room to speak with patient family at bedside. Spoke with Jaci Lazier [phone] in consultation room . Son reports, Mom lives alone next door to [facility], and she has been become increasingly confused. Harvie Heck states, she has been forgetting to eat,  personal hygiene, and immediate family members. He reports that she does not remember that she has fallen.  Family voiced having someone come in to stay with patient for a couple of hours per day. Her daughter in law Diane [phone] is her  caregiver  Provided family with Private Duty Agency list. Pt still being evaluated. CM will continue to follow to assist with discharge plan.

## 2013-08-27 DIAGNOSIS — I4891 Unspecified atrial fibrillation: Secondary | ICD-10-CM

## 2013-08-27 LAB — CBC
HCT: 39.4 % (ref 36.0–46.0)
Hemoglobin: 13.3 g/dL (ref 12.0–15.0)
MCH: 29.6 pg (ref 26.0–34.0)
MCHC: 33.8 g/dL (ref 30.0–36.0)
RDW: 13.9 % (ref 11.5–15.5)

## 2013-08-27 LAB — COMPREHENSIVE METABOLIC PANEL
ALT: 17 U/L (ref 0–35)
Albumin: 2.9 g/dL — ABNORMAL LOW (ref 3.5–5.2)
BUN: 23 mg/dL (ref 6–23)
CO2: 22 mEq/L (ref 19–32)
Calcium: 8.7 mg/dL (ref 8.4–10.5)
GFR calc Af Amer: >90 mL/min (ref >90)
GFR calc non Af Amer: 79 mL/min — ABNORMAL LOW (ref >90)
Glucose, Bld: 90 mg/dL (ref 70–99)
Sodium: 140 mEq/L (ref 137–147)
Total Protein: 6 g/dL (ref 6.0–8.3)

## 2013-08-27 LAB — TROPONIN I: Troponin I: <0.3 ng/mL (ref <0.30)

## 2013-08-27 LAB — PROTIME-INR
INR: 1.17 (ref 0.00–1.49)
Prothrombin Time: 14.7 seconds (ref 11.6–15.2)

## 2013-08-27 MED ORDER — LORAZEPAM 1 MG PO TABS
1.0000 mg | ORAL_TABLET | Freq: Once | ORAL | Status: AC
  Administered 2013-08-27: 01:00:00 1 mg via ORAL
  Filled 2013-08-27: qty 1

## 2013-08-27 MED ORDER — LORAZEPAM 1 MG PO TABS
1.0000 mg | ORAL_TABLET | Freq: Once | ORAL | Status: AC
  Administered 2013-08-27: 22:00:00 1 mg via ORAL
  Filled 2013-08-27: qty 1

## 2013-08-27 NOTE — Progress Notes (Signed)
   CARE MANAGEMENT NOTE 08/27/2013  Patient:  Brandi Ramirez, Brandi Ramirez   Account Number:  [id number]  Date Initiated:  08/27/2013  Documentation initiated by:  [facility]  Subjective/Objective Assessment:   rhabdomyolysis, mechanical fall     Action/Plan:   lives home alone, son, Brandi Ramirez [facility] # [id number]   Anticipated DC Date:     Anticipated DC Plan:  HOME W HOME HEALTH SERVICES  In-house referral  Clinical Social Worker      DC Planning Services  CM consult      Choice offered to / List presented to:             Status of service:  In process, will continue to follow Medicare Important Message given?   (If response is "NO", the following Medicare IM given date fields will be blank) Date Medicare IM given:   Date Additional Medicare IM given:    Discharge Disposition:    Per UR Regulation:    If discussed at Long Length of Stay Meetings, dates discussed:    Comments:  08/17/2013 1400 NCM spoke to son, Brandi Ramirez and they do not have someone prepared to stay with pt 24 hours at home. She lives home alone and was independent with them checking on her daily. Son states he preparing to come to hospital and wanted to discuss dc options home vs SNF. Isidoro Donning RN CCM Case Mgmt phone [phone]

## 2013-08-27 NOTE — Progress Notes (Signed)
Utilization review completed. Cordaryl Decelles, RN, BSN. 

## 2013-08-27 NOTE — Clinical Social Work Psychosocial (Signed)
Clinical Social Work Department BRIEF PSYCHOSOCIAL ASSESSMENT 08/27/2013  Patient:  Brandi Ramirez, Brandi Ramirez     Account Number:  [id number]     Admit date:  08/26/2013  Clinical Social Worker:  Lavell Luster  Date/Time:  08/27/2013 12:04 PM  Referred by:  Physician  Date Referred:  08/27/2013 Referred for  SNF Placement   Other Referral:   Interview type:  Family Other interview type:   Son interviewed for assessment as patient is confused.    PSYCHOSOCIAL DATA Living Status:  ALONE Admitted from facility:   Level of care:   Primary support name:  Brandi Ramirez Primary support relationship to patient:  CHILD, ADULT Degree of support available:   Support is good.    CURRENT CONCERNS Current Concerns  Post-Acute Placement   Other Concerns:    SOCIAL WORK ASSESSMENT / PLAN Patient admitted from home alone. PT notfied CSW that patient needs SNF care at this time and that patient should not return home. CSW called patient's son, as family was not at bedside, and discussed this recommendation. CSW explained that patient does not have a qualifying inpatient stay that is required for MEDICARE to cover SNF care and that the family will need to pay privately if the patient is to receive SNF care. Son states that this is not an option. Son wanted to know if patient could remain in hospital until she has a qualifying stay. CSW explained that since patient is medically stable for DC, this is not a possibility, and staying in the hospital would not help the patient with having a qualifying inpatient stay. CSW explained that RNCM can help the family with setting up HHPT services in the home. Son states that family will come to hospital this afternoon to pick patient up.   Assessment/plan status:  No Further Intervention Required Other assessment/ plan:   NONE   Information/referral to community resources:   NONE    PATIENT'S/FAMILY'S RESPONSE TO PLAN OF CARE: Son would like  patient to go to SNF, but family is unwilling to pay privately for SNF. Family is open to HHPT services. Family states that they cannot provide 24/7 supervision for patient, but they can check in on patient often. CSW signing off.       Roddie Mc, [place], [place], [phone]

## 2013-08-27 NOTE — ED Provider Notes (Signed)
Medical screening examination/treatment/procedure(s) were conducted as a shared visit with non-physician practitioner(s) and myself.  I personally evaluated the patient during the encounter.  EKG Interpretation    Date/Time:    Ventricular Rate:    PR Interval:    QRS Duration:   QT Interval:    QTC Calculation:   R Axis:     Text Interpretation:               Doug Sou, MD 08/27/13 [phone]

## 2013-08-27 NOTE — Evaluation (Signed)
Physical Therapy Evaluation Patient Details Name: Brandi Ramirez MRN: [id number] DOB: 15-May-1924 Today's Date: 08/27/2013 Time: 1020-1050 PT Time Calculation (min): 30 min  PT Assessment / Plan / Recommendation History of Present Illness  patient is presenting daily as she was found to have an unwitnessed fall. The patient lives alone next to her family. And the family has seen the patient yesterday at dinnertime. Around the morning when the daughter was bringing the patient's paper she called up her walking in the house but at around often on the lunchtime when she went to check on her she found the patient lying on the ground on the carpeted floor with complaining of hip pain and inability to stand up. The patient has dementia therefore does not remember how she had a fall but she was aware of her surroundings as well as her daughter. There was no incontinence noted. The patient denies any focal neurological deficit. There is some confusion which is more than her baseline as per the daughter although she is able to identify where she is as well as identify the family members.  Clinical Impression  Pt requires min A with mobility, cuing for all sequencing of tasks.  Pt with poor safety awareness and poor awareness of incontinence, oriented to self only.  Pt will benefit from 24 hour care at home, PT recommends SNF.    PT Assessment  Patient needs continued PT services    Follow Up Recommendations  SNF    Does the patient have the potential to tolerate intense rehabilitation      Barriers to Discharge Decreased caregiver support pt lives alone    Equipment Recommendations  None recommended by PT    Recommendations for Other Services     Frequency Min 3X/week    Precautions / Restrictions Precautions Precautions: Fall Restrictions Weight Bearing Restrictions: No   Pertinent Vitals/Pain No c/o pain      Mobility  Bed Mobility Bed Mobility: Supine to Sit;Sit to Supine Supine  to Sit: 4: Min assist Sit to Supine: 4: Min assist Details for Bed Mobility Assistance: cues for sequencing, lifting assist at trunk Transfers Transfers: Sit to Stand;Stand to Sit;Stand Pivot Transfers Sit to Stand: 4: Min assist Stand to Sit: 4: Min assist Stand Pivot Transfers: 4: Min assist Details for Transfer Assistance: lifting and steadying assist, cuing for sequencing Ambulation/Gait Ambulation/Gait Assistance: 4: Min assist Ambulation Distance (Feet): 5 Feet Assistive device: Rolling walker Ambulation/Gait Assistance Details: able to take a few small steps, pt very confused stating "I'm not in the right place or doing the right thing" attempted to redirect pt but pt very disoriented with poor attention    Exercises     PT Diagnosis: Difficulty walking;Generalized weakness  PT Problem List: Decreased strength;Decreased mobility;Decreased activity tolerance;Decreased balance;Decreased cognition;Decreased safety awareness PT Treatment Interventions: DME instruction;Therapeutic exercise;Wheelchair mobility training;Gait training;Balance training;Stair training;Neuromuscular re-education;Modalities;Cognitive remediation;Functional mobility training;Therapeutic activities;Patient/family education     PT Goals(Current goals can be found in the care plan section) Acute Rehab PT Goals PT Goal Formulation: Patient unable to participate in goal setting Time For Goal Achievement: 09/10/13 Potential to Achieve Goals: Good  Visit Information  Last PT Received On: 08/27/13 Assistance Needed: +1 History of Present Illness: patient is presenting daily as she was found to have an unwitnessed fall. The patient lives alone next to her family. And the family has seen the patient yesterday at dinnertime. Around the morning when the daughter was bringing the patient's paper she called up her walking in  the house but at around often on the lunchtime when she went to check on her she found the  patient lying on the ground on the carpeted floor with complaining of hip pain and inability to stand up. The patient has dementia therefore does not remember how she had a fall but she was aware of her surroundings as well as her daughter. There was no incontinence noted. The patient denies any focal neurological deficit. There is some confusion which is more than her baseline as per the daughter although she is able to identify where she is as well as identify the family members.       Prior Functioning  Home Living Family/patient expects to be discharged to:: Private residence Living Arrangements: Alone Available Help at Discharge: Family;Available PRN/intermittently Type of Home: House Home Layout: One level Home Equipment: Walker - 2 wheels Additional Comments: pt unable to give accuarte history, no family present Prior Function Level of Independence: Independent with assistive device(s) Communication Communication:  (language of confusion)    Cognition  Cognition Arousal/Alertness: Awake/alert Behavior During Therapy: Restless Overall Cognitive Status: No family/caregiver present to determine baseline cognitive functioning    Extremity/Trunk Assessment Lower Extremity Assessment Lower Extremity Assessment: Generalized weakness Cervical / Trunk Assessment Cervical / Trunk Assessment: Normal   Balance Static Standing Balance Static Standing - Balance Support: During functional activity Static Standing - Level of Assistance: 4: Min assist Static Standing - Comment/# of Minutes: pt stood 2 x 4 minutes with RW for hygiene after toileting, pt incontinent and unaware of loose stools  End of Session PT - End of Session Equipment Utilized During Treatment: Gait belt Activity Tolerance: Patient tolerated treatment well Patient left: in bed;with nursing/sitter in room;with call bell/phone within reach;with bed alarm set Nurse Communication: Mobility status  GP      Brandi Ramirez 08/27/2013, 12:09 PM

## 2013-08-27 NOTE — Progress Notes (Signed)
ED CM received incoming call from patient's son Randy,concerning patients' discharge plan. Offered to contact CM and CSW on the Unit to have them return his call. Son was Adult nurse. Roddie Mc CSW contacted he reports that Alesia CM and given Son's number. No further ED CM needs identified.

## 2013-08-27 NOTE — Progress Notes (Signed)
[facility] PROGRESS NOTE  Brandi Ramirez WUJ:[id number] DOB: 1924/06/28 DOA: 08/26/2013 PCP: Josue Hector, MD  HPI/Subjective: Pleasantly confused  Assessment/Plan: Principal Problem:   Rhabdomyolysis Active Problems:   Dry cough   Fall -Patient presents to the hospital with an unwitnessed fall. Not clear if it's syncope or mechanical fall -History of dementia, has no recollection whatsoever of the fall or symptoms prior to that. -CT showed no acute abnormalities. -Check 12-lead EKG, patient is on telemetry. -Hydrate with IV fluids, try to avoid extensive workup, doubt it's going to change management.  Rhabdomyolysis -Mild rhabdomyolysis which total CK of 962. -Patient has no renal involvement, patient is on IV fluids, continue.  Cough -Chest x-ray showed  chronic lung findings. -Does not have fever, deferred checking flu and defer starting antibiotics.  Dementia -Continue home medications, patient at baseline of confusion.   Code Status: Full code Family Communication: Plan discussed with the patient. Disposition Plan: Remains inpatient   Consultants:  None  Procedures:  None  Antibiotics:  None   Objective: Filed Vitals:   08/27/13 0949  BP: 156/56  Pulse: 114  Temp:   Resp: 35    Intake/Output Summary (Last 24 hours) at 08/27/13 1322 Last data filed at 08/27/13 0616  Gross per 24 hour  Intake 611.66 ml  Output      0 ml  Net 611.66 ml   Filed Weights   08/26/13 2328 08/27/13 0407  Weight: 53.1 kg (117 lb 1 oz) 53.1 kg (117 lb 1 oz)    Exam: General: Alert and awake,  but totally confused , not in any acute distress. HEENT: anicteric sclera, pupils reactive to light and accommodation, EOMI CVS: S1-S2 clear, no murmur rubs or gallops Chest: clear to auscultation bilaterally, no wheezing, rales or rhonchi Abdomen: soft nontender, nondistended, normal bowel sounds, no organomegaly Extremities: no cyanosis, clubbing or edema  noted bilaterally Neuro: Cranial nerves II-XII intact, no focal neurological deficits  Data Reviewed: Basic Metabolic Panel:  Recent Labs Lab 08/26/13 2021 08/27/13 0304  NA 139 140  K 3.6 3.7  CL 102 105  CO2 26 22  GLUCOSE 98 90  BUN 25* 23  CREATININE 0.61 0.62  CALCIUM 9.5 8.7   Liver Function Tests:  Recent Labs Lab 08/26/13 2021 08/27/13 0304  AST 36 34  ALT 19 17  ALKPHOS 79 67  BILITOT 1.0 1.2  PROT 7.2 6.0  ALBUMIN 3.6 2.9*   No results found for this basename: LIPASE, AMYLASE,  in the last 168 hours No results found for this basename: AMMONIA,  in the last 168 hours CBC:  Recent Labs Lab 08/26/13 2021 08/27/13 0304  WBC 8.2 6.9  HGB 14.6 13.3  HCT 43.0 39.4  MCV 87.0 87.6  PLT 208 214   Cardiac Enzymes:  Recent Labs Lab 08/26/13 0304 08/26/13 2022 08/27/13 0304 08/27/13 0730  CKTOTAL  --  957* 962*  --   CKMB  --  12.1*  --   --   TROPONINI <0.30  --   --  <0.30   BNP (last 3 results) No results found for this basename: PROBNP,  in the last 8760 hours CBG: No results found for this basename: GLUCAP,  in the last 168 hours  Micro No results found for this or any previous visit (from the past 240 hour(s)).   Studies: Dg Hip Complete Right  08/26/2013   CLINICAL DATA:  Patient fell.  Right hip pain.  EXAM: RIGHT HIP - COMPLETE 2+ VIEW  COMPARISON:  None.  FINDINGS: March degenerative changes in both hips, greater on the right. There is sclerosis, subcortical cyst formation, loss of joint space, and hypertrophic changes on both sides of the joints bilaterally. Deformity of the superior and lateral femoral heads bilaterally. Changes probably represent marked osteoarthritis but superimposed avascular necrosis is not excluded. No evidence of acute fracture or dislocation of the pelvis or hips. Degenerative changes in the lower lumbar spine. Postoperative changes.  IMPRESSION: March degenerative changes in the hips. No acute displaced fractures.    Electronically Signed   By: Burman Nieves M.D.   On: 08/26/2013 21:24   Ct Head Wo Contrast  08/26/2013   CLINICAL DATA:  Post fall, now with increased confusion  EXAM: CT HEAD WITHOUT CONTRAST  TECHNIQUE: Contiguous axial images were obtained from the base of the skull through the vertex without intravenous contrast.  COMPARISON:  02/27/2011  FINDINGS: Persistent findings of atrophy with diffuse sulcal prominence and centralized volume loss with commensurate ex vacuo dilatation of the ventricular system. Grossly unchanged scattered periventricular hypodensities compatible with microvascular ischemic disease. Right-sided basal ganglial calcifications. Given extensive background parenchymal abnormalities, there is no CT evidence for acute large territory infarct. No definite intraparenchymal extra-axial mass or hemorrhage. Unchanged size and configuration of the ventricles and basilar cisterns. Intracranial atherosclerosis. Limited visualization of the paranasal sinuses and mastoid air cells are normal. Regional soft tissues are normal. No displaced calvarial fracture.  IMPRESSION: Persistent findings of atrophy and microvascular ischemic disease without acute intracranial process.   Electronically Signed   By: Simonne Come M.D.   On: 08/26/2013 21:18   Dg Chest Port 1 View  08/27/2013   CLINICAL DATA:  Crackles and dry cough.  EXAM: PORTABLE CHEST - 1 VIEW  COMPARISON:  03/01/2011  FINDINGS: Shallow inspiration with elevation of the right hemidiaphragm. No focal consolidation in the lungs. Normal heart size and pulmonary vascularity. Peribronchial thickening and diffuse fibrosis in the lungs consistent with chronic bronchitic changes. No blunting of costophrenic angles. No pneumothorax. Similar appearance to previous study.  IMPRESSION: Shallow inspiration with elevated right hemidiaphragm. Fibrosis and chronic bronchitic changes in the lungs. No active disease. No significant change.   Electronically  Signed   By: Burman Nieves M.D.   On: 08/27/2013 00:29    Scheduled Meds: . heparin  5,000 Units Subcutaneous Q8H  . sodium chloride  3 mL Intravenous Q12H   Continuous Infusions: . sodium chloride 100 mL/hr at 08/27/13 1051       Time spent: 35 minutes    [facility] A  [facility] Pager [phone] If 7PM-7AM, please contact night-coverage at [email or web address], password [facility] 08/27/2013, 1:22 PM  LOS: 1 day
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
## 2013-08-28 DIAGNOSIS — F0391 Unspecified dementia with behavioral disturbance: Secondary | ICD-10-CM

## 2013-08-28 DIAGNOSIS — I517 Cardiomegaly: Secondary | ICD-10-CM

## 2013-08-28 LAB — URINALYSIS, ROUTINE W REFLEX MICROSCOPIC
Bilirubin Urine: NEGATIVE
Glucose, UA: NEGATIVE mg/dL
Ketones, ur: NEGATIVE mg/dL
pH: 6 (ref 5.0–8.0)

## 2013-08-28 LAB — BASIC METABOLIC PANEL
BUN: 17 mg/dL (ref 6–23)
CO2: 22 mEq/L (ref 19–32)
Chloride: 111 mEq/L (ref 96–112)
Creatinine, Ser: 0.59 mg/dL (ref 0.50–1.10)
Glucose, Bld: 88 mg/dL (ref 70–99)
Sodium: 143 mEq/L (ref 137–147)

## 2013-08-28 LAB — URINE MICROSCOPIC-ADD ON

## 2013-08-28 MED ORDER — HALOPERIDOL 1 MG PO TABS
1.0000 mg | ORAL_TABLET | Freq: Once | ORAL | Status: AC
  Administered 2013-08-28: 22:00:00 1 mg via ORAL
  Filled 2013-08-28: qty 1

## 2013-08-28 MED ORDER — QUETIAPINE 12.5 MG HALF TABLET
12.5000 mg | ORAL_TABLET | Freq: Two times a day (BID) | ORAL | Status: DC
  Administered 2013-08-28 – 2013-08-31 (×6): 12.5 mg via ORAL
  Filled 2013-08-28 (×7): qty 1

## 2013-08-28 MED ORDER — QUETIAPINE 12.5 MG HALF TABLET
12.5000 mg | ORAL_TABLET | Freq: Two times a day (BID) | ORAL | Status: DC
  Filled 2013-08-28: qty 1

## 2013-08-28 NOTE — Progress Notes (Signed)
[facility] PROGRESS NOTE  Brandi Ramirez EXB:[id number] DOB: [birth year] DOA: 08/26/2013 PCP: Josue Hector, MD  HPI/Subjective: Patient is confused and disoriented, does not know where she is. She does not remember the events that transpired prior to this hospitalization either.  Assessment/Plan: Principal Problem:   Rhabdomyolysis Active Problems:   Dry cough   Fall -Patient presents to the hospital with an unwitnessed fall. Not clear if it's syncope or mechanical fall -History of dementia -CT showed no acute abnormalities. -Finding may be related to deconditioning  Rhabdomyolysis -Mild rhabdomyolysis which total CK of 962. -[place] secondary to fall. Followup on repeat CPK  Cough -Chest x-ray showed  chronic lung findings. -Does not have fever, deferred checking flu and defer starting antibiotics.  Dementia with behavioral changes -Patient becoming increasing agitated this afternoon, will provide surgical 12.5 mg by mouth twice a day  Code Status: Full code Family Communication: Plan discussed with the patient. Disposition Plan: Remains inpatient   Consultants:  None  Procedures:  None  Antibiotics:  None   Objective: Filed Vitals:   08/28/13 1353  BP: 137/72  Pulse: 73  Temp: 98.3 F (36.8 C)  Resp: 20    Intake/Output Summary (Last 24 hours) at 08/28/13 1604 Last data filed at 08/28/13 1100  Gross per 24 hour  Intake    243 ml  Output      0 ml  Net    243 ml   Filed Weights   08/26/13 2328 08/27/13 0407 08/28/13 0455  Weight: 53.1 kg (117 lb 1 oz) 53.1 kg (117 lb 1 oz) 54 kg (119 lb 0.8 oz)    Exam: General: Alert and awake,  but totally confused , not in any acute distress. HEENT: anicteric sclera, pupils reactive to light and accommodation, EOMI CVS: S1-S2 clear, no murmur rubs or gallops Chest: clear to auscultation bilaterally, no wheezing, rales or rhonchi Abdomen: soft nontender, nondistended, normal bowel sounds, no  organomegaly Extremities: no cyanosis, clubbing or edema noted bilaterally Neuro: Cranial nerves II-XII intact, no focal neurological deficits  Data Reviewed: Basic Metabolic Panel:  Recent Labs Lab 08/26/13 2021 08/27/13 0304 08/28/13 0614  NA 139 140 143  K 3.6 3.7 3.7  CL 102 105 111  CO2 26 22 22   GLUCOSE 98 90 88  BUN 25* 23 17  CREATININE 0.61 0.62 0.59  CALCIUM 9.5 8.7 8.5   Liver Function Tests:  Recent Labs Lab 08/26/13 2021 08/27/13 0304  AST 36 34  ALT 19 17  ALKPHOS 79 67  BILITOT 1.0 1.2  PROT 7.2 6.0  ALBUMIN 3.6 2.9*   No results found for this basename: LIPASE, AMYLASE,  in the last 168 hours No results found for this basename: AMMONIA,  in the last 168 hours CBC:  Recent Labs Lab 08/26/13 2021 08/27/13 0304  WBC 8.2 6.9  HGB 14.6 13.3  HCT 43.0 39.4  MCV 87.0 87.6  PLT 208 214   Cardiac Enzymes:  Recent Labs Lab 08/26/13 0304 08/26/13 2022 08/27/13 0304 08/27/13 0730 08/27/13 1310  CKTOTAL  --  957* 962*  --   --   CKMB  --  12.1*  --   --   --   TROPONINI <0.30  --   --  <0.30 <0.30   BNP (last 3 results) No results found for this basename: PROBNP,  in the last 8760 hours CBG: No results found for this basename: GLUCAP,  in the last 168 hours  Micro No results found for this or  any previous visit (from the past 240 hour(s)).   Studies: Dg Hip Complete Right  08/26/2013   CLINICAL DATA:  Patient fell.  Right hip pain.  EXAM: RIGHT HIP - COMPLETE 2+ VIEW  COMPARISON:  None.  FINDINGS: March degenerative changes in both hips, greater on the right. There is sclerosis, subcortical cyst formation, loss of joint space, and hypertrophic changes on both sides of the joints bilaterally. Deformity of the superior and lateral femoral heads bilaterally. Changes probably represent marked osteoarthritis but superimposed avascular necrosis is not excluded. No evidence of acute fracture or dislocation of the pelvis or hips. Degenerative changes  in the lower lumbar spine. Postoperative changes.  IMPRESSION: March degenerative changes in the hips. No acute displaced fractures.   Electronically Signed   By: Burman Nieves M.D.   On: 08/26/2013 21:24   Ct Head Wo Contrast  08/26/2013   CLINICAL DATA:  Post fall, now with increased confusion  EXAM: CT HEAD WITHOUT CONTRAST  TECHNIQUE: Contiguous axial images were obtained from the base of the skull through the vertex without intravenous contrast.  COMPARISON:  02/27/2011  FINDINGS: Persistent findings of atrophy with diffuse sulcal prominence and centralized volume loss with commensurate ex vacuo dilatation of the ventricular system. Grossly unchanged scattered periventricular hypodensities compatible with microvascular ischemic disease. Right-sided basal ganglial calcifications. Given extensive background parenchymal abnormalities, there is no CT evidence for acute large territory infarct. No definite intraparenchymal extra-axial mass or hemorrhage. Unchanged size and configuration of the ventricles and basilar cisterns. Intracranial atherosclerosis. Limited visualization of the paranasal sinuses and mastoid air cells are normal. Regional soft tissues are normal. No displaced calvarial fracture.  IMPRESSION: Persistent findings of atrophy and microvascular ischemic disease without acute intracranial process.   Electronically Signed   By: Simonne Come M.D.   On: 08/26/2013 21:18   Dg Chest Port 1 View  08/27/2013   CLINICAL DATA:  Crackles and dry cough.  EXAM: PORTABLE CHEST - 1 VIEW  COMPARISON:  03/01/2011  FINDINGS: Shallow inspiration with elevation of the right hemidiaphragm. No focal consolidation in the lungs. Normal heart size and pulmonary vascularity. Peribronchial thickening and diffuse fibrosis in the lungs consistent with chronic bronchitic changes. No blunting of costophrenic angles. No pneumothorax. Similar appearance to previous study.  IMPRESSION: Shallow inspiration with elevated  right hemidiaphragm. Fibrosis and chronic bronchitic changes in the lungs. No active disease. No significant change.   Electronically Signed   By: Burman Nieves M.D.   On: 08/27/2013 00:29    Scheduled Meds: . heparin  5,000 Units Subcutaneous Q8H  . QUEtiapine  12.5 mg Oral BID  . sodium chloride  3 mL Intravenous Q12H   Continuous Infusions: . sodium chloride 75 mL/hr at 08/27/13 2250       Time spent: 35 minutes    Jeralyn Bennett  [facility] Pager [phone] If 7PM-7AM, please contact night-coverage at [email or web address], password [facility] 08/28/2013, 4:04 PM  LOS: 2 days

## 2013-08-28 NOTE — Clinical Social Work Placement (Signed)
Clinical Social Work Department CLINICAL SOCIAL WORK PLACEMENT NOTE 08/28/2013  Patient:  Brandi Ramirez, Brandi Ramirez  Account Number:  [id number] Admit date:  08/26/2013  Clinical Social Worker:  Lavell Luster  Date/time:  08/28/2013 09:44 AM  Clinical Social Work is seeking post-discharge placement for this patient at the following level of care:   SKILLED NURSING   (*CSW will update this form in Epic as items are completed)   08/28/2013  Patient/family provided with [facility] Department of Clinical Social Work's list of facilities offering this level of care within the geographic area requested by the patient (or if unable, by the patient's family).  08/28/2013  Patient/family informed of their freedom to choose among providers that offer the needed level of care, that participate in Medicare, Medicaid or managed care program needed by the patient, have an available bed and are willing to accept the patient.  08/28/2013  Patient/family informed of [facility]' ownership interest in [facility], as well as of the fact that they are under no obligation to receive care at this facility.  PASARR submitted to EDS on 08/28/2013 PASARR number received from [employer] on 08/28/2013  FL2 transmitted to all facilities in geographic area requested by pt/family on  08/28/2013 FL2 transmitted to all facilities within larger geographic area on   Patient informed that his/her managed care company has contracts with or will negotiate with  certain facilities, including the following:     Patient/family informed of bed offers received:   Patient chooses bed at  Physician recommends and patient chooses bed at    Patient to be transferred to  on   Patient to be transferred to facility by   The following physician request were entered in Epic:   Additional Comments:   Roddie Mc, [place], [place], [phone]

## 2013-08-28 NOTE — Progress Notes (Signed)
  Echocardiogram 2D Echocardiogram has been performed.  Brandi Ramirez FRANCES 08/28/2013, 12:00 PM

## 2013-08-28 NOTE — Clinical Social Work Placement (Signed)
Clinical Social Work Department CLINICAL SOCIAL WORK PLACEMENT NOTE 08/28/2013  Patient:  Brandi Ramirez, Brandi Ramirez  Account Number:  [id number] Admit date:  08/26/2013  Clinical Social Worker:  Lavell Luster  Date/time:  08/28/2013 09:44 AM  Clinical Social Work is seeking post-discharge placement for this patient at the following level of care:   SKILLED NURSING   (*CSW will update this form in Epic as items are completed)   08/28/2013  Patient/family provided with [facility] Department of Clinical Social Work's list of facilities offering this level of care within the geographic area requested by the patient (or if unable, by the patient's family).  08/28/2013  Patient/family informed of their freedom to choose among providers that offer the needed level of care, that participate in Medicare, Medicaid or managed care program needed by the patient, have an available bed and are willing to accept the patient.  08/28/2013  Patient/family informed of [facility]' ownership interest in [facility], as well as of the fact that they are under no obligation to receive care at this facility.  PASARR submitted to EDS on 08/28/2013 PASARR number received from EDS on 08/28/2013  FL2 transmitted to all facilities in geographic area requested by pt/family on  08/28/2013 FL2 transmitted to all facilities within larger geographic area on   Patient informed that his/her managed care company has contracts with or will negotiate with  certain facilities, including the following:     Patient/family informed of bed offers received:  08/28/2013 Patient chooses bed at [facility] Physician recommends and patient chooses bed at    Patient to be transferred to Maxie Better, [facility] on   Patient to be transferred to facility by   The following physician request were entered in Epic:   Additional Comments:   Roddie Mc, [place], [place], [phone]

## 2013-08-28 NOTE — Progress Notes (Signed)
Occupational Therapy Evaluation Patient Details Name: Brandi Ramirez MRN: [id number] DOB: 1924-02-20 Today's Date: 08/28/2013 Time: 5621-3086 OT Time Calculation (min): 29 min  OT Assessment / Plan / Recommendation History of present illness patient is presenting daily as she was found to have an unwitnessed fall. The patient lives alone next to her family. And the family has seen the patient yesterday at dinnertime. Around the morning when the daughter was bringing the patient's paper she called up her walking in the house but at around often on the lunchtime when she went to check on her she found the patient lying on the ground on the carpeted floor with complaining of hip pain and inability to stand up. The patient has dementia therefore does not remember how she had a fall but she was aware of her surroundings as well as her daughter. There was no incontinence noted. The patient denies any focal neurological deficit. There is some confusion which is more than her baseline as per the daughter although she is able to identify where she is as well as identify the family members.   Clinical Impression   PTA, pt apparently lived alone and was independent with ADL. On eval, pt apparently confused and not oriented x 4. Pt agitated at times and actually required this therapist to totally assist her to chair as she was attempting to climb into my arms. Total A with all ADL. Discussed concerns with nurse as this appears to be dramatic change from PLOF and change from yesterday's PT note. Pt will need SNF. Pt will benefit from skilled OT services to facilitate D/C to next venue due to below deficits.    OT Assessment  Patient needs continued OT Services    Follow Up Recommendations  SNF    Barriers to Discharge Decreased caregiver support    Equipment Recommendations  None recommended by OT    Recommendations for Other Services    Frequency  Min 2X/week    Precautions / Restrictions  Precautions Precautions: Fall   Pertinent Vitals/Pain no apparent distress     ADL  Eating/Feeding: Supervision/safety;Set up Where Assessed - Eating/Feeding: Chair Grooming: Moderate assistance Where Assessed - Grooming: Supported sitting Upper Body Bathing: Maximal assistance Where Assessed - Upper Body Bathing: Supported sitting Lower Body Bathing: Maximal assistance Where Assessed - Lower Body Bathing: Supported sit to stand Upper Body Dressing: Maximal assistance Where Assessed - Upper Body Dressing: Supported sitting Lower Body Dressing: Maximal assistance Where Assessed - Lower Body Dressing: Supported sit to Pharmacist, hospital: Maximal Dentist Method: Surveyor, minerals: Materials engineer and Hygiene: +1 Total assistance (incontinent of BM) Where Assessed - Toileting Clothing Manipulation and Hygiene: Sit to stand from 3-in-1 or toilet Equipment Used: Gait belt Transfers/Ambulation Related to ADLs: Max A. Unable to ambulate. ANxiety/confusion liniting ability to funcitonally ambulate ADL Comments: Max to total a for all ADL    OT Diagnosis: Generalized weakness;Cognitive deficits;Altered mental status  OT Problem List: Decreased strength;Decreased activity tolerance;Impaired balance (sitting and/or standing);Decreased cognition;Decreased safety awareness;Decreased knowledge of use of DME or AE OT Treatment Interventions: Self-care/ADL training;Therapeutic exercise;DME and/or AE instruction;Therapeutic activities;Cognitive remediation/compensation;Patient/family education;Balance training   OT Goals(Current goals can be found in the care plan section) Acute Rehab OT Goals Patient Stated Goal: unable to state OT Goal Formulation: Patient unable to participate in goal setting Time For Goal Achievement: 09/11/12 Potential to Achieve Goals: Fair  Visit Information  Last OT Received On:  08/28/13 Assistance Needed: +  1 History of Present Illness: patient is presenting daily as she was found to have an unwitnessed fall. The patient lives alone next to her family. And the family has seen the patient yesterday at dinnertime. Around the morning when the daughter was bringing the patient's paper she called up her walking in the house but at around often on the lunchtime when she went to check on her she found the patient lying on the ground on the carpeted floor with complaining of hip pain and inability to stand up. The patient has dementia therefore does not remember how she had a fall but she was aware of her surroundings as well as her daughter. There was no incontinence noted. The patient denies any focal neurological deficit. There is some confusion which is more than her baseline as per the daughter although she is able to identify where she is as well as identify the family members.       Prior Functioning     Home Living Family/patient expects to be discharged to:: Skilled nursing facility Prior Function Level of Independence: Independent with assistive device(s) Communication Communication: Other (comment) (confusion)         Vision/Perception Vision - History Baseline Vision: No visual deficits Vision - Assessment Eye Alignment: Within Functional Limits Perception Perception: Not tested Praxis Praxis: Impaired Praxis Impairment Details: Perseveration;Initiation   Cognition  Cognition Arousal/Alertness: Awake/alert Behavior During Therapy: Restless;Anxious Overall Cognitive Status: Impaired/Different from baseline Area of Impairment: Orientation;Attention;Memory;Following commands;Safety/judgement;Awareness;Problem solving Orientation Level: Disoriented to;Place;Time;Situation Current Attention Level: Focused Memory: Decreased recall of precautions;Decreased short-term memory Following Commands: Follows one step commands inconsistently Safety/Judgement:  Decreased awareness of safety;Decreased awareness of deficits Awareness: Intellectual Problem Solving: Slow processing;Decreased initiation;Difficulty sequencing;Requires verbal cues;Requires tactile cues General Comments: confused    Extremity/Trunk Assessment Upper Extremity Assessment Upper Extremity Assessment: Generalized weakness Lower Extremity Assessment Lower Extremity Assessment: Generalized weakness Cervical / Trunk Assessment Cervical / Trunk Assessment: Other exceptions (unable toachieve uprightposture. posterior lean)     Mobility Bed Mobility Bed Mobility: Supine to Sit Supine to Sit: 2: Max assist Transfers Transfers: Sit to Stand;Stand to Sit Sit to Stand: 2: Max assist;From bed Stand to Sit: 2: Max assist;To chair/3-in-1 Details for Transfer Assistance: During transfer, pt became very anxious and started stepping and required total A to transfer to chair to avoid falling on floor.     Exercise     Balance Static Standing Balance Static Standing - Balance Support: During functional activity Static Standing - Level of Assistance: 2: Max assist   End of Session OT - End of Session Equipment Utilized During Treatment: Gait belt Activity Tolerance: Treatment limited secondary to agitation Patient left: in chair;with call bell/phone within reach;with chair alarm set;with nursing/sitter in room Nurse Communication: Mobility status;Other (comment) (D/C concerns)  GO     Lowry Bala,HILLARY 08/28/2013, 11:30 AM Luisa Dago, OTR/L  [phone] 08/28/2013

## 2013-08-28 NOTE — Progress Notes (Signed)
Patient is being combative and very disoriented, constantly climbing out of bed. Order for Seroquel was previously ordered but due to dose being given at 1600 another dose isn't scheduled until 10 am. Provider on call contacted. Order for Haldol PO was given per MD Le. Haldol administered and will continue to monitor.   Brandi Mazor J. Joanna Hews

## 2013-08-29 LAB — BASIC METABOLIC PANEL
BUN: 12 mg/dL (ref 6–23)
CO2: 25 mEq/L (ref 19–32)
Calcium: 8.7 mg/dL (ref 8.4–10.5)
Chloride: 110 mEq/L (ref 96–112)
Creatinine, Ser: 0.67 mg/dL (ref 0.50–1.10)
GFR calc Af Amer: 89 mL/min — ABNORMAL LOW (ref >90)
GFR calc non Af Amer: 77 mL/min — ABNORMAL LOW (ref >90)
Glucose, Bld: 85 mg/dL (ref 70–99)
Potassium: 3.8 mEq/L (ref 3.7–5.3)
Sodium: 145 mEq/L (ref 137–147)

## 2013-08-29 LAB — CBC
HCT: 35.8 % — ABNORMAL LOW (ref 36.0–46.0)
Hemoglobin: 11.7 g/dL — ABNORMAL LOW (ref 12.0–15.0)
MCH: 28.6 pg (ref 26.0–34.0)
MCHC: 32.7 g/dL (ref 30.0–36.0)
MCV: 87.5 fL (ref 78.0–100.0)
Platelets: 186 10*3/uL (ref 150–400)
RBC: 4.09 MIL/uL (ref 3.87–5.11)
RDW: 14 % (ref 11.5–15.5)
WBC: 5.3 10*3/uL (ref 4.0–10.5)

## 2013-08-29 LAB — CK: Total CK: 3082 U/L — ABNORMAL HIGH (ref 7–177)

## 2013-08-29 MED ORDER — SODIUM CHLORIDE 0.9 % IV BOLUS (SEPSIS)
500.0000 mL | Freq: Once | INTRAVENOUS | Status: AC
  Administered 2013-08-29: 14:00:00 500 mL via INTRAVENOUS

## 2013-08-29 MED ORDER — WHITE PETROLATUM GEL
Status: AC
  Administered 2013-08-29: 17:00:00
  Filled 2013-08-29: qty 5

## 2013-08-29 MED ORDER — SODIUM CHLORIDE 0.9 % IV SOLN
INTRAVENOUS | Status: DC
  Administered 2013-08-29 (×2): via INTRAVENOUS
  Administered 2013-08-30: 100 mL/h via INTRAVENOUS
  Administered 2013-08-30 – 2013-08-31 (×3): via INTRAVENOUS

## 2013-08-29 MED ORDER — ASPIRIN 81 MG PO CHEW
81.0000 mg | CHEWABLE_TABLET | Freq: Every day | ORAL | Status: DC
  Administered 2013-08-29 – 2013-08-31 (×3): 81 mg via ORAL
  Filled 2013-08-29 (×3): qty 1

## 2013-08-29 NOTE — Progress Notes (Signed)
RN reported that patient went into afib with HR 104. This is actually not new, as in February 27, 2011 admission, she had episode of PAF, rate controlled.  It was decided at that time that no full anticoagulation be done, as she was not a candidate, now with hx of falling, even less so.  Will give her low dose ASA and continue to watch her rate.  She is asymptomatic.

## 2013-08-29 NOTE — Progress Notes (Signed)
[facility] PROGRESS NOTE  Brandi Ramirez ZOX:[id number] DOB: 1924-08-01 DOA: 08/26/2013 PCP: Josue Hector, MD  HPI/Subjective: Overnight patient going into atrial fibrillation, with ventricular rates in the low 100s, spontaneously converting to sinus rhythm early this morning. She has a history of paroxysmal atrial fibrillation, presently not on anticoagulation. This morning she remains confused and disoriented, however sitting up, tolerating by mouth intake.  Assessment/Plan: Principal Problem:   Rhabdomyolysis Active Problems:   Dry cough   Fall -Patient presents to the hospital with an unwitnessed fall. Not clear if it's syncope or mechanical fall -History of dementia -CT showed no acute abnormalities. -Finding may be related to deconditioning  Rhabdomyolysis -Repeat CPK on this mornings lab work at 3082, represents an increase from CPK of 962 on 08/27/2013. I believe this likely resulted from the fall, given upward trend in CPK, will give her a 500 mL bolus of normal saline, run maintenance fluids overnight. Repeat CPK in a.m.  Cough -Chest x-ray showed  chronic lung findings. -Does not have fever, deferred checking flu and defer starting antibiotics.  Dementia with behavioral changes -Patient becoming increasing agitated this afternoon, will provide seroquel 12.5 mg by mouth twice a day  Code Status: Full code Family Communication: Plan discussed with the patient. Disposition Plan: Remains inpatient   Consultants:  None  Procedures:  None  Antibiotics:  None   Objective: Filed Vitals:   08/29/13 0538  BP: 109/72  Pulse: 80  Temp: 97.6 F (36.4 C)  Resp: 20    Intake/Output Summary (Last 24 hours) at 08/29/13 1402 Last data filed at 08/28/13 1800  Gross per 24 hour  Intake    480 ml  Output      0 ml  Net    480 ml   Filed Weights   08/27/13 0407 08/28/13 0455 08/29/13 0538  Weight: 53.1 kg (117 lb 1 oz) 54 kg (119 lb 0.8 oz)  54.477 kg (120 lb 1.6 oz)    Exam: General: Alert and awake,  but totally confused , not in any acute distress. HEENT: anicteric sclera, pupils reactive to light and accommodation, EOMI CVS: S1-S2 clear, no murmur rubs or gallops Chest: clear to auscultation bilaterally, no wheezing, rales or rhonchi Abdomen: soft nontender, nondistended, normal bowel sounds, no organomegaly Extremities: no cyanosis, clubbing or edema noted bilaterally Neuro: Cranial nerves II-XII intact, no focal neurological deficits  Data Reviewed: Basic Metabolic Panel:  Recent Labs Lab 08/26/13 2021 08/27/13 0304 08/28/13 0614 08/29/13 0500  NA 139 140 143 145  K 3.6 3.7 3.7 3.8  CL 102 105 111 110  CO2 26 22 22 25   GLUCOSE 98 90 88 85  BUN 25* 23 17 12   CREATININE 0.61 0.62 0.59 0.67  CALCIUM 9.5 8.7 8.5 8.7   Liver Function Tests:  Recent Labs Lab 08/26/13 2021 08/27/13 0304  AST 36 34  ALT 19 17  ALKPHOS 79 67  BILITOT 1.0 1.2  PROT 7.2 6.0  ALBUMIN 3.6 2.9*   No results found for this basename: LIPASE, AMYLASE,  in the last 168 hours No results found for this basename: AMMONIA,  in the last 168 hours CBC:  Recent Labs Lab 08/26/13 2021 08/27/13 0304 08/29/13 0500  WBC 8.2 6.9 5.3  HGB 14.6 13.3 11.7*  HCT 43.0 39.4 35.8*  MCV 87.0 87.6 87.5  PLT 208 214 186   Cardiac Enzymes:  Recent Labs Lab 08/26/13 0304 08/26/13 2022 08/27/13 0304 08/27/13 0730 08/27/13 1310 08/29/13 0500  CKTOTAL  --  957* 962*  --   --  3082*  CKMB  --  12.1*  --   --   --   --   TROPONINI <0.30  --   --  <0.30 <0.30  --    BNP (last 3 results) No results found for this basename: PROBNP,  in the last 8760 hours CBG: No results found for this basename: GLUCAP,  in the last 168 hours  Micro No results found for this or any previous visit (from the past 240 hour(s)).   Studies: No results found.  Scheduled Meds: . aspirin  81 mg Oral Daily  . heparin  5,000 Units Subcutaneous Q8H  .  QUEtiapine  12.5 mg Oral BID  . sodium chloride  500 mL Intravenous Once  . sodium chloride  3 mL Intravenous Q12H   Continuous Infusions: . sodium chloride         Time spent: 35 minutes    Jeralyn BennettZAMORA, Micah Galeno  [facility] Pager [phone] If 7PM-7AM, please contact night-coverage at [email or web address], password [email or web address] 08/29/2013, 2:02 PM  LOS: 3 days

## 2013-08-30 LAB — CBC
HCT: 37.7 % (ref 36.0–46.0)
Hemoglobin: 12.7 g/dL (ref 12.0–15.0)
MCH: 29.5 pg (ref 26.0–34.0)
MCHC: 33.7 g/dL (ref 30.0–36.0)
MCV: 87.5 fL (ref 78.0–100.0)
Platelets: 192 10*3/uL (ref 150–400)
RBC: 4.31 MIL/uL (ref 3.87–5.11)
RDW: 14 % (ref 11.5–15.5)
WBC: 4.8 10*3/uL (ref 4.0–10.5)

## 2013-08-30 LAB — CK: Total CK: 2492 U/L — ABNORMAL HIGH (ref 7–177)

## 2013-08-30 LAB — BASIC METABOLIC PANEL
BUN: 7 mg/dL (ref 6–23)
CO2: 26 mEq/L (ref 19–32)
CREATININE: 0.6 mg/dL (ref 0.50–1.10)
Calcium: 8.7 mg/dL (ref 8.4–10.5)
Chloride: 110 mEq/L (ref 96–112)
GFR calc non Af Amer: 80 mL/min — ABNORMAL LOW (ref >90)
Glucose, Bld: 90 mg/dL (ref 70–99)
Potassium: 3.8 mEq/L (ref 3.7–5.3)
Sodium: 146 mEq/L (ref 137–147)

## 2013-08-30 MED ORDER — QUETIAPINE 12.5 MG HALF TABLET: 12.5000 mg | ORAL_TABLET | Freq: Two times a day (BID) | ORAL | Status: AC

## 2013-08-30 MED ORDER — ASPIRIN 81 MG PO CHEW: 81.0000 mg | CHEWABLE_TABLET | Freq: Every day | ORAL | Status: AC

## 2013-08-30 NOTE — Discharge Summary (Addendum)
Physician Discharge Summary  Brandi Ramirez ZOX:[id number] DOB: 1923-12-07 DOA: 08/26/2013  PCP: Josue Hector, MD  Admit date: 08/26/2013 Discharge date: 08/30/2013  Time spent: 35 minutes  Recommendations for Outpatient Follow-up:  1. Please follow up on BMP in 2-3 days 2. Please follow up on CPK in 2-3 days, patient's CPK coming down from 3,082 on 08/29/13 to 1,578 on 08/31/2013  Discharge Diagnoses:  Principal Problem:   Rhabdomyolysis Active Problems:   Dry cough Dementia with behavioral changes Recurrent Falls Paroxysmal Atrial Fibrillation  Discharge Condition: Stable  Diet recommendation: Heart Healthy, PLEASE ENCOURAGE PLENTY OF FLUID INTAKE  Filed Weights   08/28/13 0455 08/29/13 0538 08/30/13 0500  Weight: 54 kg (119 lb 0.8 oz) 54.477 kg (120 lb 1.6 oz) 55.2 kg (121 lb 11.1 oz)    History of present illness:  Brandi Ramirez is a 80 y.o. female with Past medical history of hypertension and dementia.  The patient is coming from home.  The patient is presenting daily as she was found to have an unwitnessed fall. The patient lives alone next to her family. And the family has seen the patient yesterday at dinnertime. Around the morning when the daughter was bringing the patient's paper she called up her walking in the house but at around often on the lunchtime when she went to check on her she found the patient lying on the ground on the carpeted floor with complaining of hip pain and inability to stand up. The patient has dementia therefore does not remember how she had a fall but she was aware of her surroundings as well as her daughter. There was no incontinence noted. The patient denies any focal neurological deficit. There is some confusion which is more than her baseline as per the daughter although she is able to identify where she is as well as identify the family members.  There is no fever or chills or diarrhea or burning urination noted by the  daughter.  Hospital Course:  Patient is an 80 year old female with a past medical history of dementia who had been living at home, was found down on a carpeted floor for an unknown period of time by family members. Patient with a history of dementia, unable to participate in her own plan of care or provide a reliable history. She is complaining of right hip pain for which there was concern for hip fracture however x-ray of hip did not reveal fractures. Lab work was otherwise unremarkable, without an obvious source of infection. Chest x-ray showed no infiltrate as urinalysis was unremarkable. She was found to have rhabdomyolysis initially with a CPK of 962, peaking at 3082 on 08/29/2013. This came down to 2492 on 08/30/2013 with IV fluid resuscitation. She was seen and evaluated by occupational therapy and physical therapy who recommended placement to skilled nursing facility. Additionally it was felt that patient would be unsafe to go home alone. Patient did have episodes of agitation during this hospitalization particularly at night in which I suspect secondary to sundowning. She was administered a Seroquel 12.5 mg by mouth twice a day. She presently remains on IV fluids, plan to continue monitoring over the next 24-48 hours and discharge her to skilled nursing facility with continuing downward trend to her CPK.   Consultations:  Social work  Discharge Exam: Filed Vitals:   08/30/13 0556  BP: 131/88  Pulse: 99  Temp: 97.5 F (36.4 C)  Resp: 18    General: Confused, disoriented, in no acute distress Cardiovascular: Regular  rate rhythm normal S1-S2 Respiratory: Lungs are clear to auscultation bilaterally Abdomen: Soft nontender nondistended  Discharge Instructions  Discharge Orders   Future Orders Complete By Expires   Call MD for:  difficulty breathing, headache or visual disturbances  As directed    Call MD for:  extreme fatigue  As directed    Call MD for:  persistant dizziness or  light-headedness  As directed    Call MD for:  persistant nausea and vomiting  As directed    Call MD for:  severe uncontrolled pain  As directed    Call MD for:  temperature >100.4  As directed    Diet - low sodium heart healthy  As directed    Increase activity slowly  As directed        Medication List         aspirin 81 MG chewable tablet  Chew 1 tablet (81 mg total) by mouth daily.     multivitamin with minerals Tabs tablet  Take 1 tablet by mouth daily.     QUEtiapine 12.5 mg Tabs tablet  Commonly known as:  SEROQUEL  Take 0.5 tablets (12.5 mg total) by mouth 2 (two) times daily.       No Known Allergies     Follow-up Information   Follow up with Josue HectorNYLAND,LEONARD ROBERT, MD In 1 week.   Specialty:  Family Medicine   Contact information:   [address] [place] [address] [phone]        The results of significant diagnostics from this hospitalization (including imaging, microbiology, ancillary and laboratory) are listed below for reference.    Significant Diagnostic Studies: Dg Hip Complete Right  08/26/2013   CLINICAL DATA:  Patient fell.  Right hip pain.  EXAM: RIGHT HIP - COMPLETE 2+ VIEW  COMPARISON:  None.  FINDINGS: March degenerative changes in both hips, greater on the right. There is sclerosis, subcortical cyst formation, loss of joint space, and hypertrophic changes on both sides of the joints bilaterally. Deformity of the superior and lateral femoral heads bilaterally. Changes probably represent marked osteoarthritis but superimposed avascular necrosis is not excluded. No evidence of acute fracture or dislocation of the pelvis or hips. Degenerative changes in the lower lumbar spine. Postoperative changes.  IMPRESSION: March degenerative changes in the hips. No acute displaced fractures.   Electronically Signed   By: Burman NievesWilliam  Stevens M.D.   On: 08/26/2013 21:24   Ct Head Wo Contrast  08/26/2013   CLINICAL DATA:  Post fall, now with increased  confusion  EXAM: CT HEAD WITHOUT CONTRAST  TECHNIQUE: Contiguous axial images were obtained from the base of the skull through the vertex without intravenous contrast.  COMPARISON:  02/27/2011  FINDINGS: Persistent findings of atrophy with diffuse sulcal prominence and centralized volume loss with commensurate ex vacuo dilatation of the ventricular system. Grossly unchanged scattered periventricular hypodensities compatible with microvascular ischemic disease. Right-sided basal ganglial calcifications. Given extensive background parenchymal abnormalities, there is no CT evidence for acute large territory infarct. No definite intraparenchymal extra-axial mass or hemorrhage. Unchanged size and configuration of the ventricles and basilar cisterns. Intracranial atherosclerosis. Limited visualization of the paranasal sinuses and mastoid air cells are normal. Regional soft tissues are normal. No displaced calvarial fracture.  IMPRESSION: Persistent findings of atrophy and microvascular ischemic disease without acute intracranial process.   Electronically Signed   By: Simonne ComeJohn  Watts M.D.   On: 08/26/2013 21:18   Dg Chest Port 1 View  08/27/2013   CLINICAL DATA:  Crackles  and dry cough.  EXAM: PORTABLE CHEST - 1 VIEW  COMPARISON:  03/01/2011  FINDINGS: Shallow inspiration with elevation of the right hemidiaphragm. No focal consolidation in the lungs. Normal heart size and pulmonary vascularity. Peribronchial thickening and diffuse fibrosis in the lungs consistent with chronic bronchitic changes. No blunting of costophrenic angles. No pneumothorax. Similar appearance to previous study.  IMPRESSION: Shallow inspiration with elevated right hemidiaphragm. Fibrosis and chronic bronchitic changes in the lungs. No active disease. No significant change.   Electronically Signed   By: Burman Nieves M.D.   On: 08/27/2013 00:29    Microbiology: No results found for this or any previous visit (from the past 240 hour(s)).    Labs: Basic Metabolic Panel:  Recent Labs Lab 08/26/13 2021 08/27/13 0304 08/28/13 0614 08/29/13 0500 08/30/13 0600  NA 139 140 143 145 146  K 3.6 3.7 3.7 3.8 3.8  CL 102 105 111 110 110  CO2 26 22 22 25 26   GLUCOSE 98 90 88 85 90  BUN 25* 23 17 12 7   CREATININE 0.61 0.62 0.59 0.67 0.60  CALCIUM 9.5 8.7 8.5 8.7 8.7   Liver Function Tests:  Recent Labs Lab 08/26/13 2021 08/27/13 0304  AST 36 34  ALT 19 17  ALKPHOS 79 67  BILITOT 1.0 1.2  PROT 7.2 6.0  ALBUMIN 3.6 2.9*   No results found for this basename: LIPASE, AMYLASE,  in the last 168 hours No results found for this basename: AMMONIA,  in the last 168 hours CBC:  Recent Labs Lab 08/26/13 2021 08/27/13 0304 08/29/13 0500 08/30/13 0600  WBC 8.2 6.9 5.3 4.8  HGB 14.6 13.3 11.7* 12.7  HCT 43.0 39.4 35.8* 37.7  MCV 87.0 87.6 87.5 87.5  PLT 208 214 186 192   Cardiac Enzymes:  Recent Labs Lab 08/26/13 0304 08/26/13 2022 08/27/13 0304 08/27/13 0730 08/27/13 1310 08/29/13 0500 08/30/13 0600  CKTOTAL  --  957* 962*  --   --  3082* 2492*  CKMB  --  12.1*  --   --   --   --   --   TROPONINI <0.30  --   --  <0.30 <0.30  --   --    BNP: BNP (last 3 results) No results found for this basename: PROBNP,  in the last 8760 hours CBG: No results found for this basename: GLUCAP,  in the last 168 hours     Signed:  Jeralyn Bennett  [facility] 08/30/2013, 12:04 PM

## 2013-08-30 NOTE — Clinical Social Work Note (Signed)
CSW received information from Consulting civil engineerCharge RN and MD stating that pt will [facility]. CSW spoke to [facility], she stated that pt would be able to be admitted to their facility over the weekend. MD notified that [facility] requesting discharge paperwork (summary and AVS) on 08/30/2013 in order for facility to gather prescribed medications. CSW to fax discharge paperwork to facility and leave handoff for weekend CSW discussing possible discharge.  Darlyn ChamberEmily [place], LCSWA Clinical Social Worker [phone]

## 2013-08-31 LAB — BASIC METABOLIC PANEL
BUN: 9 mg/dL (ref 6–23)
CO2: 22 mEq/L (ref 19–32)
Calcium: 8.6 mg/dL (ref 8.4–10.5)
Chloride: 109 mEq/L (ref 96–112)
Creatinine, Ser: 0.68 mg/dL (ref 0.50–1.10)
GFR, EST AFRICAN AMERICAN: 89 mL/min — AB (ref >90)
GFR, EST NON AFRICAN AMERICAN: 76 mL/min — AB (ref >90)
Glucose, Bld: 88 mg/dL (ref 70–99)
Potassium: 3.5 mEq/L — ABNORMAL LOW (ref 3.7–5.3)
SODIUM: 143 meq/L (ref 137–147)

## 2013-08-31 LAB — CK: Total CK: 1578 U/L — ABNORMAL HIGH (ref 7–177)

## 2013-08-31 NOTE — Plan of Care (Signed)
Problem: Phase III Progression Outcomes Goal: Pain controlled on oral analgesia Outcome: Not Applicable Date Met:  48/18/59 Pt has had no pain  Problem: Discharge Progression Outcomes Goal: Pain controlled with appropriate interventions Outcome: Not Applicable Date Met:  09/31/12 Pt has had no pain

## 2013-08-31 NOTE — Plan of Care (Signed)
Problem: Phase III Progression Outcomes Goal: Activity at appropriate level-compared to baseline (UP IN CHAIR FOR HEMODIALYSIS)  Outcome: Completed/Met Date Met:  08/31/13  Adequate for discharge  Problem: Discharge Progression Outcomes Goal: Hemodynamically stable Outcome: Completed/Met Date Met:  08/31/13  Adequate for discharge Goal: Complications resolved/controlled Outcome: Completed/Met Date Met:  08/31/13  Adequate for discharge Goal: Activity appropriate for discharge plan Outcome: Completed/Met Date Met:  08/31/13  Adequate for discharge

## 2013-08-31 NOTE — Progress Notes (Signed)
Patient cleared for discharge. Packet copied and placed in shadow chart. CSW called patient's son, informed him of discharge. He is agreeable and is on his way up to be with patient when she leaves. Ptar called for transportation. Family aware that there is no guarantee of payment.  Sadler Teschner C. Zein Helbing MSW, LCSW [phone]

## 2013-08-31 NOTE — Progress Notes (Signed)
[facility] PROGRESS NOTE  CHAISE PASSARELLA ZOX:[id number] DOB: 1924-06-10 DOA: 08/26/2013 PCP: Josue Hector, MD  HPI/Subjective: Patient remains stable. She continues to be confused and disoriented, however is tolerating PO intake. She appears comfortable, in no acute distress, satting 90's on room air. Kidney function stable and CPK continues to trend down. Will transfer to SNF today.  Assessment/Plan: Principal Problem:   Rhabdomyolysis Active Problems:   Dry cough   Fall -Patient presents to the hospital with an unwitnessed fall. Not clear if it's syncope or mechanical fall -History of dementia -CT showed no acute abnormalities. -Finding may be related to deconditioning  Rhabdomyolysis -Repeat CPK on this mornings lab work at 3082, represents an increase from CPK of 962 on 08/27/2013. I believe this likely resulted from the fall, given upward trend in CPK -CPK coming down to 1,500 range with IV fluid administration. She is tolerating PO intake.   Cough -Chest x-ray showed  chronic lung findings. -Does not have fever, deferred checking flu and defer starting antibiotics.  Dementia with behavioral changes -Patient becoming increasing agitated this afternoon, will provide seroquel 12.5 mg by mouth twice a day  Code Status: Full code Family Communication: Plan discussed with the patient. Disposition Plan: Will discharge to SNF today.    Consultants:  None  Procedures:  None  Antibiotics:  None   Objective: Filed Vitals:   08/31/13 0638  BP: 147/69  Pulse: 72  Temp: 98.6 F (37 C)  Resp: 16    Intake/Output Summary (Last 24 hours) at 08/31/13 1043 Last data filed at 08/30/13 1941  Gross per 24 hour  Intake 2108.33 ml  Output      0 ml  Net 2108.33 ml   Filed Weights   08/28/13 0455 08/29/13 0538 08/30/13 0500  Weight: 54 kg (119 lb 0.8 oz) 54.477 kg (120 lb 1.6 oz) 55.2 kg (121 lb 11.1 oz)    Exam: General: Alert and awake,  but  totally confused , not in any acute distress. HEENT: anicteric sclera, pupils reactive to light and accommodation, EOMI CVS: S1-S2 clear, no murmur rubs or gallops Chest: clear to auscultation bilaterally, no wheezing, rales or rhonchi Abdomen: soft nontender, nondistended, normal bowel sounds, no organomegaly Extremities: no cyanosis, clubbing or edema noted bilaterally Neuro: Cranial nerves II-XII intact, no focal neurological deficits  Data Reviewed: Basic Metabolic Panel:  Recent Labs Lab 08/27/13 0304 08/28/13 0614 08/29/13 0500 08/30/13 0600 08/31/13 0415  NA 140 143 145 146 143  K 3.7 3.7 3.8 3.8 3.5*  CL 105 111 110 110 109  CO2 22 22 25 26 22   GLUCOSE 90 88 85 90 88  BUN 23 17 12 7 9   CREATININE 0.62 0.59 0.67 0.60 0.68  CALCIUM 8.7 8.5 8.7 8.7 8.6   Liver Function Tests:  Recent Labs Lab 08/26/13 2021 08/27/13 0304  AST 36 34  ALT 19 17  ALKPHOS 79 67  BILITOT 1.0 1.2  PROT 7.2 6.0  ALBUMIN 3.6 2.9*   No results found for this basename: LIPASE, AMYLASE,  in the last 168 hours No results found for this basename: AMMONIA,  in the last 168 hours CBC:  Recent Labs Lab 08/26/13 2021 08/27/13 0304 08/29/13 0500 08/30/13 0600  WBC 8.2 6.9 5.3 4.8  HGB 14.6 13.3 11.7* 12.7  HCT 43.0 39.4 35.8* 37.7  MCV 87.0 87.6 87.5 87.5  PLT 208 214 186 192   Cardiac Enzymes:  Recent Labs Lab 08/26/13 0304 08/26/13 2022 08/27/13 0304 08/27/13 0730 08/27/13  1310 08/29/13 0500 08/30/13 0600 08/31/13 0415  CKTOTAL  --  957* 962*  --   --  3082* 2492* 1578*  CKMB  --  12.1*  --   --   --   --   --   --   TROPONINI <0.30  --   --  <0.30 <0.30  --   --   --    BNP (last 3 results) No results found for this basename: PROBNP,  in the last 8760 hours CBG: No results found for this basename: GLUCAP,  in the last 168 hours  Micro No results found for this or any previous visit (from the past 240 hour(s)).   Studies: No results found.  Scheduled Meds: .  aspirin  81 mg Oral Daily  . heparin  5,000 Units Subcutaneous Q8H  . QUEtiapine  12.5 mg Oral BID  . sodium chloride  3 mL Intravenous Q12H   Continuous Infusions: . sodium chloride 100 mL/hr at 08/31/13 [id number]       Time spent: 35 minutes    Jeralyn BennettZAMORA, Gayle Martinez  [facility] Pager [phone] If 7PM-7AM, please contact night-coverage at [email or web address], password [email or web address] 08/31/2013, 10:43 AM  LOS: 5 days

## 2013-08-31 NOTE — Progress Notes (Signed)
[facility] Tele box removed and CMT verified of discharge. Son called and notified of patient being transported. Report called and given to RN and envelope given with discharge.

## 2013-09-16 ENCOUNTER — Emergency Department (HOSPITAL_COMMUNITY): Payer: Medicare Other

## 2013-09-16 ENCOUNTER — Encounter (HOSPITAL_COMMUNITY): Payer: Self-pay | Admitting: Emergency Medicine

## 2013-09-16 ENCOUNTER — Inpatient Hospital Stay (HOSPITAL_COMMUNITY)
Admission: EM | Admit: 2013-09-16 | Discharge: 2013-09-29 | DRG: 871 | Disposition: E | Payer: Medicare Other | Attending: Pulmonary Disease | Admitting: Pulmonary Disease

## 2013-09-16 DIAGNOSIS — F0390 Unspecified dementia without behavioral disturbance: Secondary | ICD-10-CM | POA: Diagnosis present

## 2013-09-16 DIAGNOSIS — R579 Shock, unspecified: Secondary | ICD-10-CM

## 2013-09-16 DIAGNOSIS — A419 Sepsis, unspecified organism: Principal | ICD-10-CM

## 2013-09-16 DIAGNOSIS — R4181 Age-related cognitive decline: Secondary | ICD-10-CM | POA: Diagnosis present

## 2013-09-16 DIAGNOSIS — Z87891 Personal history of nicotine dependence: Secondary | ICD-10-CM

## 2013-09-16 DIAGNOSIS — E7800 Pure hypercholesterolemia, unspecified: Secondary | ICD-10-CM | POA: Diagnosis present

## 2013-09-16 DIAGNOSIS — Z9181 History of falling: Secondary | ICD-10-CM

## 2013-09-16 DIAGNOSIS — R4182 Altered mental status, unspecified: Secondary | ICD-10-CM | POA: Diagnosis present

## 2013-09-16 DIAGNOSIS — R6520 Severe sepsis without septic shock: Secondary | ICD-10-CM

## 2013-09-16 DIAGNOSIS — I4819 Other persistent atrial fibrillation: Secondary | ICD-10-CM

## 2013-09-16 DIAGNOSIS — Z7982 Long term (current) use of aspirin: Secondary | ICD-10-CM

## 2013-09-16 DIAGNOSIS — N179 Acute kidney failure, unspecified: Secondary | ICD-10-CM | POA: Diagnosis present

## 2013-09-16 DIAGNOSIS — R6521 Severe sepsis with septic shock: Secondary | ICD-10-CM

## 2013-09-16 DIAGNOSIS — I10 Essential (primary) hypertension: Secondary | ICD-10-CM | POA: Diagnosis present

## 2013-09-16 DIAGNOSIS — J189 Pneumonia, unspecified organism: Secondary | ICD-10-CM | POA: Diagnosis present

## 2013-09-16 DIAGNOSIS — E872 Acidosis, unspecified: Secondary | ICD-10-CM | POA: Diagnosis present

## 2013-09-16 DIAGNOSIS — Z66 Do not resuscitate: Secondary | ICD-10-CM | POA: Diagnosis present

## 2013-09-16 DIAGNOSIS — R197 Diarrhea, unspecified: Secondary | ICD-10-CM | POA: Diagnosis present

## 2013-09-16 DIAGNOSIS — J9600 Acute respiratory failure, unspecified whether with hypoxia or hypercapnia: Secondary | ICD-10-CM | POA: Diagnosis present

## 2013-09-16 DIAGNOSIS — I4891 Unspecified atrial fibrillation: Secondary | ICD-10-CM | POA: Diagnosis present

## 2013-09-16 LAB — COMPREHENSIVE METABOLIC PANEL
ALBUMIN: 2.9 g/dL — AB (ref 3.5–5.2)
ALT: 18 U/L (ref 0–35)
AST: 39 U/L — ABNORMAL HIGH (ref 0–37)
Alkaline Phosphatase: 69 U/L (ref 39–117)
BUN: 35 mg/dL — AB (ref 6–23)
CALCIUM: 9.8 mg/dL (ref 8.4–10.5)
CO2: 14 mEq/L — ABNORMAL LOW (ref 19–32)
CREATININE: 2.05 mg/dL — AB (ref 0.50–1.10)
Chloride: 97 mEq/L (ref 96–112)
GFR calc non Af Amer: 21 mL/min — ABNORMAL LOW (ref >90)
GFR, EST AFRICAN AMERICAN: 24 mL/min — AB (ref >90)
GLUCOSE: 133 mg/dL — AB (ref 70–99)
Potassium: 4 mEq/L (ref 3.7–5.3)
Sodium: 138 mEq/L (ref 137–147)
TOTAL PROTEIN: 7 g/dL (ref 6.0–8.3)
Total Bilirubin: 0.7 mg/dL (ref 0.3–1.2)

## 2013-09-16 LAB — CBC WITH DIFFERENTIAL/PLATELET
BASOS ABS: 0 10*3/uL (ref 0.0–0.1)
Basophils Relative: 0 % (ref 0–1)
Eosinophils Absolute: 0 10*3/uL (ref 0.0–0.7)
Eosinophils Relative: 0 % (ref 0–5)
HCT: 41.1 % (ref 36.0–46.0)
Hemoglobin: 13.8 g/dL (ref 12.0–15.0)
LYMPHS ABS: 1.4 10*3/uL (ref 0.7–4.0)
Lymphocytes Relative: 30 % (ref 12–46)
MCH: 29.2 pg (ref 26.0–34.0)
MCHC: 33.6 g/dL (ref 30.0–36.0)
MCV: 87.1 fL (ref 78.0–100.0)
MONO ABS: 0.7 10*3/uL (ref 0.1–1.0)
Monocytes Relative: 14 % — ABNORMAL HIGH (ref 3–12)
Neutro Abs: 2.6 10*3/uL (ref 1.7–7.7)
Neutrophils Relative %: 56 % (ref 43–77)
PLATELETS: 311 10*3/uL (ref 150–400)
RBC: 4.72 MIL/uL (ref 3.87–5.11)
RDW: 14.3 % (ref 11.5–15.5)
WBC: 4.7 10*3/uL (ref 4.0–10.5)

## 2013-09-16 LAB — URINALYSIS, ROUTINE W REFLEX MICROSCOPIC
Glucose, UA: NEGATIVE mg/dL
Hgb urine dipstick: NEGATIVE
Ketones, ur: 15 mg/dL — AB
LEUKOCYTES UA: NEGATIVE
Nitrite: NEGATIVE
PROTEIN: NEGATIVE mg/dL
Specific Gravity, Urine: 1.022 (ref 1.005–1.030)
Urobilinogen, UA: 0.2 mg/dL (ref 0.0–1.0)
pH: 5 (ref 5.0–8.0)

## 2013-09-16 LAB — LACTIC ACID, PLASMA: Lactic Acid, Venous: 11.1 mmol/L — ABNORMAL HIGH (ref 0.5–2.2)

## 2013-09-16 LAB — APTT: aPTT: 38 seconds — ABNORMAL HIGH (ref 24–37)

## 2013-09-16 LAB — CORTISOL: Cortisol, Plasma: 72.5 ug/dL

## 2013-09-16 LAB — MAGNESIUM: MAGNESIUM: 2 mg/dL (ref 1.5–2.5)

## 2013-09-16 LAB — CK: Total CK: 217 U/L — ABNORMAL HIGH (ref 7–177)

## 2013-09-16 LAB — PHOSPHORUS: Phosphorus: 5.9 mg/dL — ABNORMAL HIGH (ref 2.3–4.6)

## 2013-09-16 LAB — PROTIME-INR
INR: 2.21 — ABNORMAL HIGH (ref 0.00–1.49)
PROTHROMBIN TIME: 23.8 s — AB (ref 11.6–15.2)

## 2013-09-16 MED ORDER — HEPARIN SODIUM (PORCINE) 5000 UNIT/ML IJ SOLN
5000.0000 [IU] | Freq: Three times a day (TID) | INTRAMUSCULAR | Status: DC
  Filled 2013-09-16 (×3): qty 1

## 2013-09-16 MED ORDER — MORPHINE SULFATE 2 MG/ML IJ SOLN: 0.5000 mg | INTRAMUSCULAR | Status: DC | PRN

## 2013-09-16 MED ORDER — SODIUM CHLORIDE 0.9 % IV BOLUS (SEPSIS)
1000.0000 mL | Freq: Once | INTRAVENOUS | Status: AC
  Administered 2013-09-16: 1000 mL via INTRAVENOUS

## 2013-09-16 MED ORDER — PIPERACILLIN-TAZOBACTAM 3.375 G IVPB
3.3750 g | Freq: Three times a day (TID) | INTRAVENOUS | Status: DC
  Filled 2013-09-16 (×3): qty 50

## 2013-09-16 MED ORDER — SODIUM CHLORIDE 0.9 % IV SOLN
INTRAVENOUS | Status: DC
  Administered 2013-09-16: 14:00:00 via INTRAVENOUS

## 2013-09-16 MED ORDER — PANTOPRAZOLE SODIUM 40 MG IV SOLR
40.0000 mg | Freq: Every day | INTRAVENOUS | Status: DC
  Filled 2013-09-16: qty 40

## 2013-09-16 MED ORDER — PIPERACILLIN-TAZOBACTAM 3.375 G IVPB
3.3750 g | Freq: Once | INTRAVENOUS | Status: AC
  Administered 2013-09-16: 3.375 g via INTRAVENOUS
  Filled 2013-09-16: qty 50

## 2013-09-16 MED ORDER — ACETAMINOPHEN 650 MG RE SUPP
650.0000 mg | Freq: Once | RECTAL | Status: AC
  Administered 2013-09-16: 650 mg via RECTAL
  Filled 2013-09-16: qty 1

## 2013-09-16 MED ORDER — VANCOMYCIN HCL 500 MG IV SOLR: 500.0000 mg | INTRAVENOUS | Status: DC

## 2013-09-16 MED ORDER — NOREPINEPHRINE BITARTRATE 1 MG/ML IJ SOLN
2.0000 ug/min | Freq: Once | INTRAVENOUS | Status: AC
  Administered 2013-09-16: 5 ug/min via INTRAVENOUS
  Filled 2013-09-16: qty 4

## 2013-09-16 MED ORDER — SODIUM CHLORIDE 0.9 % IV SOLN
1250.0000 mg | Freq: Once | INTRAVENOUS | Status: AC
  Administered 2013-09-16: 1250 mg via INTRAVENOUS
  Filled 2013-09-16: qty 1250

## 2013-09-16 MED ORDER — NOREPINEPHRINE BITARTRATE 1 MG/ML IJ SOLN
2.0000 ug/kg/min | INTRAMUSCULAR | Status: DC
  Filled 2013-09-16: qty 4

## 2013-09-18 LAB — INFLUENZA VIRUS AG, A+B (DFA)

## 2013-09-19 NOTE — Discharge Summary (Signed)
Name: Brandi Ramirez MRN: [id number] DOB: September 07, 1923    ADMISSION DATE:  09/03/2013   REFERRING MD :  EDP PRIMARY SERVICE: PCCM  CHIEF COMPLAINT:  AMS/Shock  BRIEF PATIENT DESCRIPTION: \ 80 yo WF, NHP, with dementia who developed a fever and decreased LOC over 48 hours. Noted to have recent diarrhea but not suspected to be c dif. Was ambulating with walker after recent discharge 1/2 for fall  SIGNIFICANT EVENTS / STUDIES:  1-19 LCB  LINES / TUBES: PIV  CULTURES: none  ANTIBIOTICS: 1-19 vanc>> 1-19 pip>>  HISTORY OF PRESENT ILLNESS:   80 yo WF, NHP, with dementia who developed a fever and decreased LOC over 48 hours. Noted to have recent diarrhea but not suspected to be c dif. Noted to be refractory to fluids and was started on levophed. Treated for HCAP with vancomycin and zosyn. PCCM asked to evaluate. Following discussion between son and Dr. Vassie Loll she will be a LCB. No CPR, shock, intubation and levophed will be capped at 10. MSO4 will be given Q1h as needed for comfort.  PAST MEDICAL HISTORY :  Past Medical History  Diagnosis Date  . Hypertension   . High cholesterol     "took her off Lipitor ~ 6 months ago" (08/26/2013)  . Dementia    Past Surgical History  Procedure Laterality Date  . Total abdominal hysterectomy  1960's  . Cataract extraction Right ~ 2012   Prior to Admission medications   Medication Sig Start Date End Date Taking? Authorizing Provider  aspirin 81 MG chewable tablet Chew 1 tablet (81 mg total) by mouth daily. 08/30/13   Jeralyn Bennett, MD  Multiple Vitamin (MULTIVITAMIN WITH MINERALS) TABS tablet Take 1 tablet by mouth daily.    Historical Provider, MD  QUEtiapine (SEROQUEL) 12.5 mg TABS tablet Take 0.5 tablets (12.5 mg total) by mouth 2 (two) times daily. 08/30/13   Jeralyn Bennett, MD   CXR: patchy consolidation of the lateral right mid lung and right lung base.  Patchy consolidation of left lung base is identified. There is chronic  elevation of the right hemidiaphragm.   COURSE:  PULMONARY A:  Acute Hypoxic resp failure Bilateral  pna -HCAP  P:   O2  NIMV Abx see flows  CARDIOVASCULAR A:  PAF Shock P:  Hydration Pressors as needed No CPR,Shock  RENAL Lab Results  Component Value Date   CREATININE 2.05* 09/21/2013   CREATININE 0.68 08/31/2013   CREATININE 0.60 08/30/2013    A:  Renal Insuff P:   Hydration Follow creatine   GASTROINTESTINAL A:  Recent diarrhea  P:   Check c diff for further liquid stools  HEMATOLOGIC A:  No acute issue P:    INFECTIOUS A:   Presumed Pna P:   Tx as HCAP, see flows  ENDOCRINE A:  No acute issue  P:   Check cortisol for completeness  NEUROLOGIC A:  Decreased LOC from presumed hypotension Demented at base line  P:   Monitor LOC after fluids/Abx/Pressors      COURSE: 80 yo demented NHP who presented with hypotension, decreased loc and recent cough and diarrhea. Discussed with son & d -in law - explained critical nature & poor prognosis given poor baseline & severe hypoxia & shock. We agreed to continue with reasonable medical care - I explained that bipap at this time would be more discomfort & if resp distress, will use morphine for comfort. Cap pressors at current levels after 3L fluid given. She passed away  later that day  Cause of Death - Septic shock, Health care associated pneumonia  Cyril Mourningakesh Annalea Alguire MD. FCCP. [facility] Pager [phone] If no response call [phone]    09/19/2013, 2:13 AM

## 2013-09-22 LAB — CULTURE, BLOOD (ROUTINE X 2)
Culture: NO GROWTH
Culture: NO GROWTH

## 2013-09-29 NOTE — ED Notes (Signed)
Pt transported to 2100 via stretcher and monitor by Textron IncLeslie RN

## 2013-09-29 NOTE — ED Notes (Signed)
Family at bedside. 

## 2013-09-29 NOTE — ED Provider Notes (Addendum)
I saw and evaluated the patient, reviewed the resident's note and I agree with the findings and plan.  EKG Interpretation    Date/Time:  Monday September 16 2013 08:44:23 EST Ventricular Rate:  131 PR Interval:    QRS Duration: 92 QT Interval:  344 QTC Calculation: 508 R Axis:   91 Text Interpretation:  Atrial fibrillation Right axis deviation Borderline low voltage, extremity leads Repolarization abnormality, prob rate related Prolonged QT interval Confirmed by Freida BusmanALLEN  MD, Michelena Culmer ([id number]) on 2014-06-08 9:20:22 AM           Patient here with altered mental status and fever from the nursing home. She has a baseline history of dementia and had a temperature prior to arrival of 103. EMS was contacted and patient transported here. Patient is in extremis on arrival. I did speak with the patient's medical power of attorney who wishes the patient to be a DO NOT INTUBATE. They're requesting medical management at this time. Workup is pending  10:04 AM Patient has been reassessed. She is to start on vancomycin Zosyn. Family is agreeable to her being a DO NOT RESUSCITATE as mentioned above. Blood pressure has been proved with IV hydration. She is mentating better as well 2. She will be admitted to step down  CRITICAL CARE Performed by: Toy BakerALLEN,Odean Fester T Total critical care time: 50 Critical care time was exclusive of separately billable procedures and treating other patients. Critical care was necessary to treat or prevent imminent or life-threatening deterioration. Critical care was time spent personally by me on the following activities: development of treatment plan with patient and/or surrogate as well as nursing, discussions with consultants, evaluation of patient's response to treatment, examination of patient, obtaining history from patient or surrogate, ordering and performing treatments and interventions, ordering and review of laboratory studies, ordering and review of radiographic studies,  pulse oximetry and re-evaluation of patient's condition.   Toy BakerAnthony T Rickiya Picariello, MD Jan 08, 2014 [id number]  Toy BakerAnthony T Aren Cherne, MD Jan 08, 2014 [id number]  Toy BakerAnthony T Tecla Mailloux, MD Jan 08, 2014 [id number]

## 2013-09-29 NOTE — Progress Notes (Signed)
No spontaneous respirations, no apical pulse auscultated, pupils fixed and unreactive. Confirmed with Lamount CrankerKelli Hunt RN. Time of Death 1411

## 2013-09-29 NOTE — ED Provider Notes (Signed)
I saw and evaluated the patient, reviewed the resident's note and I agree with the findings and plan.  EKG Interpretation    Date/Time:  Monday September 16 2013 08:44:23 EST Ventricular Rate:  131 PR Interval:    QRS Duration: 92 QT Interval:  344 QTC Calculation: 508 R Axis:   91 Text Interpretation:  Atrial fibrillation Right axis deviation Borderline low voltage, extremity leads Repolarization abnormality, prob rate related Prolonged QT interval Confirmed by Freida BusmanALLEN  MD, Madaline Lefeber ([id number]) on 09/21/2013 9:20:22 AM             Toy BakerAnthony T Dylin Breeden, MD 09/20/13 [phone]

## 2013-09-29 NOTE — Progress Notes (Signed)
Unit CM UR Completed by [facility] ED CM  W. Granger Chui RN  

## 2013-09-29 NOTE — Progress Notes (Signed)
ANTIBIOTIC CONSULT NOTE - INITIAL  Pharmacy Consult for vancomycin, zosyn and renal adjust antibiotics Indication: pneumonia  No Known Allergies  Patient Measurements:   Body Weight: 55.2 kg  Vital Signs: Temp: 102 F (38.9 C) (01/19 1150) Temp src: Core (Comment) (01/19 1150) BP: 85/66 mmHg (01/19 1150) Pulse Rate: 113 (01/19 1150) Intake/Output from previous day:   Intake/Output from this shift: Total I/O In: 3300 [I.V.:3300] Out: -   Labs:  Recent Labs  09/22/2013 0845  WBC 4.7  HGB 13.8  PLT 311  CREATININE 2.05*   The CrCl is unknown because both a height and weight (above a minimum accepted value) are required for this calculation. No results found for this basename: VANCOTROUGH, VANCOPEAK, VANCORANDOM, GENTTROUGH, GENTPEAK, GENTRANDOM, TOBRATROUGH, TOBRAPEAK, TOBRARND, AMIKACINPEAK, AMIKACINTROU, AMIKACIN,  in the last 72 hours   Microbiology: No results found for this or any previous visit (from the past 720 hour(s)).  Medical History: Past Medical History  Diagnosis Date  . Hypertension   . High cholesterol     "took her off Lipitor ~ 6 months ago" (08/26/2013)  . Dementia     Medications:  Scheduled:  . heparin  5,000 Units Subcutaneous Q8H  . pantoprazole (PROTONIX) IV  40 mg Intravenous QHS   Assessment: 80 yo lady to start broad spectrum antibiotics for suspected PNA.  She has had decreased LOC and fever over last 48 hours. Her SrCr 2.05 with ~CrCl 20-25 ml/min.   Goal of Therapy:  Vancomycin trough level 15-20 mcg/ml  Plan:  Pt received first dose of zosyn and vancomycin 1250 mg in ED. Cont zosyn 3.375 gm IV q8 hours-IE Vancomycin 500mg  IV q24 hours F/u renal function, clinical course and cultures. Check vanc trough when appropriate.   Brandi Ramirez 09/26/2013,12:13 PM

## 2013-09-29 NOTE — ED Notes (Signed)
Admitting MD at bedside.

## 2013-09-29 NOTE — ED Notes (Signed)
MD Minor at bedside.

## 2013-09-29 NOTE — ED Notes (Signed)
Brett CanalesSteve Minor requested to dc Bipap and start pt on NRB at 15 L via [place] RT

## 2013-09-29 NOTE — Progress Notes (Signed)
Dr. Vassie LollAlva paged [phone] to update pt status. No call back

## 2013-09-29 NOTE — ED Notes (Signed)
Dr. Isaias CowmanAllan at bedside to update family

## 2013-09-29 NOTE — ED Notes (Signed)
Per [facility], staff reports pt had a temp of 100.3 at 0530, they administered Tylenol 650 mg, recheck of temp at 0730 was 102. They were also concerned for AMS. I/O started by EMS

## 2013-09-29 NOTE — ED Notes (Signed)
DR. Freida BusmanAllen at bedside, verbal order to hold Levophed until 2nd liter of NS had completed in order to see if pt's MAP will increase to greater than 65

## 2013-09-29 NOTE — ED Notes (Signed)
MD Vassie LollAlva at bedside.

## 2013-09-29 NOTE — H&P (Signed)
Name: Brandi Ramirez MRN: [id number] DOB: [birth year]    ADMISSION DATE:  08/29/2013   REFERRING MD :  EDP PRIMARY SERVICE: PCCM  CHIEF COMPLAINT:  AMS/Shock  BRIEF PATIENT DESCRIPTION: \ 80 yo WF, NHP, with dementia who developed a fever and decreased LOC over 48 hours. Noted to have recent diarrhea but not suspected to be c dif. Was ambulating with walker after recent discharge 1/2 for fall  SIGNIFICANT EVENTS / STUDIES:  1-19 LCB  LINES / TUBES: PIV  CULTURES: none  ANTIBIOTICS: 1-19 vanc>> 1-19 pip>>  HISTORY OF PRESENT ILLNESS:   80 yo WF, NHP, with dementia who developed a fever and decreased LOC over 48 hours. Noted to have recent diarrhea but not suspected to be c dif. Noted to be refractory to fluids and was started on levophed. Treated for HCAP with vancomycin and zosyn. PCCM asked to evaluate. Following discussion between son and Dr. Vassie Loll she will be a LCB. No CPR, shock, intubation and levophed will be capped at 10. MSO4 will be given Q1h as needed for comfort.  PAST MEDICAL HISTORY :  Past Medical History  Diagnosis Date  . Hypertension   . High cholesterol     "took her off Lipitor ~ 6 months ago" (08/26/2013)  . Dementia    Past Surgical History  Procedure Laterality Date  . Total abdominal hysterectomy  1960's  . Cataract extraction Right ~ 2012   Prior to Admission medications   Medication Sig Start Date End Date Taking? Authorizing Provider  aspirin 81 MG chewable tablet Chew 1 tablet (81 mg total) by mouth daily. 08/30/13   Jeralyn Bennett, MD  Multiple Vitamin (MULTIVITAMIN WITH MINERALS) TABS tablet Take 1 tablet by mouth daily.    Historical Provider, MD  QUEtiapine (SEROQUEL) 12.5 mg TABS tablet Take 0.5 tablets (12.5 mg total) by mouth 2 (two) times daily. 08/30/13   Jeralyn Bennett, MD   No Known Allergies  FAMILY HISTORY:  History reviewed. No pertinent family history. SOCIAL HISTORY:  reports that she has quit smoking. Her smoking use  included Cigarettes. She smoked 0.50 packs per day. She has never used smokeless tobacco. She reports that she does not drink alcohol or use illicit drugs.  REVIEW OF SYSTEMS: unable to obtain since poorly responsive  SUBJECTIVE:  Frail ill elderly female VITAL SIGNS: Temp:  [102 F (38.9 C)-104.7 F (40.4 C)] 102 F (38.9 C) (01/19 1128) Pulse Rate:  [30-128] 121 (01/19 1128) Resp:  [26-33] 32 (01/19 1128) BP: (58-91)/(29-68) 91/68 mmHg (01/19 1110) SpO2:  [83 %-100 %] 94 % (01/19 1128) FiO2 (%):  [100 %] 100 % (01/19 1128) HEMODYNAMICS:   VENTILATOR SETTINGS: Vent Mode:  [-] BIPAP FiO2 (%):  [100 %] 100 % INTAKE / OUTPUT: Intake/Output     01/18 0701 - 01/19 0700 01/19 0701 - 01/20 0700   I.V.  2800   Total Intake   2800   Net   +2800          PHYSICAL EXAMINATION: General:  Frail elderly white female on NIMVS and Pressors. Neuro:  Decreased LOC, withdraws to pain. No follows commands HEENT:  Dry oral mucosa, no JVD/LAN. Hemorraghic sclera noted  Cardiovascular:  HSR RRR Lungs:  Coarse rhonchi on NIMVS Abdomen:  Soft +bs Musculoskeletal:  Wasted musculature  Skin:  Rt lower ext long term non healing ulcer.  LABS:  CBC  Recent Labs Lab 09/15/2013 0845  WBC 4.7  HGB 13.8  HCT 41.1  PLT 311  Coag's No results found for this basename: APTT, INR,  in the last 168 hours BMET  Recent Labs Lab 09/15/2013 0845  NA 138  K 4.0  CL 97  CO2 14*  BUN 35*  CREATININE 2.05*  GLUCOSE 133*   Electrolytes  Recent Labs Lab 09/18/2013 0845  CALCIUM 9.8   Sepsis Markers  Recent Labs Lab 09/13/2013 0845  LATICACIDVEN 11.1*   ABG No results found for this basename: PHART, PCO2ART, PO2ART,  in the last 168 hours Liver Enzymes  Recent Labs Lab 09/24/2013 0845  AST 39*  ALT 18  ALKPHOS 69  BILITOT 0.7  ALBUMIN 2.9*   Cardiac Enzymes No results found for this basename: TROPONINI, PROBNP,  in the last 168 hours Glucose No results found for this basename:  GLUCAP,  in the last 168 hours  Imaging Dg Chest Portable 1 View  09/22/2013   CLINICAL DATA:  Short of breath  EXAM: PORTABLE CHEST - 1 VIEW  COMPARISON:  None.  FINDINGS: The heart size and mediastinal contours are stable. There patchy consolidation of the lateral right mid lung and right lung base. Patchy consolidation of left lung base is identified. There is chronic elevation of the right hemidiaphragm. The visualized skeletal structures are stable.  IMPRESSION: Bilateral pneumonias.   Electronically Signed   By: Sherian ReinWei-Chen  Lin M.D.   On: 09/05/2013 09:09     CXR: See above  ASSESSMENT / PLAN:  PULMONARY A:  Acute Hypoxic resp failure Bilateral  pna -HCAP  P:   O2  NIMVS(try off) Abx see flows  CARDIOVASCULAR A:  PAF Shock P:  Hydration Pressors as needed No CPR,Shock  RENAL Lab Results  Component Value Date   CREATININE 2.05* 09/20/2013   CREATININE 0.68 08/31/2013   CREATININE 0.60 08/30/2013    A:  Renal Insuff P:   Hydration Follow creatine   GASTROINTESTINAL A:  Recent diarrhea  P:   Check c diff for further liquid stools  HEMATOLOGIC A:  No acute issue P:    INFECTIOUS A:   Presumed Pna P:   Tx as HCAP, see flows  ENDOCRINE A:  No acute issue  P:   Check cortisol for completeness  NEUROLOGIC A:  Decreased LOC from presumed hypotension Demented at base line  P:   Monitor LOC after fluids/Abx/Pressors    Brett CanalesSteve Minor ACNP [facility] Pager [phone] till 3 pm If no answer page [phone]  TODAY'S SUMMARY: 80 yo demented NHP who presented with hypotension, decreased loc and recent cough and diarrhea. Discussed with son & d -in law - explained critical nature & poor prognosis given poor baseline & severe hypoxia & shock. We agreed to continue with reasonable medical care - I explained that bipap at this time would be more discomfort & if resp distress, will use morphine for comfort. Cap pressors at current levels after 3L fluid  given.  Care during the described time interval was provided by me and/or other providers on the critical care team.  I have reviewed this patient's available data, including medical history, events of note, physical examination and test results as part of my evaluation  CC time x  60 m  Cyril Mourningakesh Alva MD. Tonny BollmanFCCP. [place] [facility] Pager [phone] If no response call [phone]    09/19/2013, 11:35 AM

## 2013-09-29 NOTE — ED Provider Notes (Signed)
CSN: [id number]     Arrival date & time 09/12/2013  4098 History   First MD Initiated Contact with Patient 09/14/2013 [id number]     Chief Complaint  Patient presents with  . Altered Mental Status  . Fever    HPI: Brandi Ramirez is an 80 yo F with history of HTN, HLD and dementia who presents with altered mental status and fever. Patient is not able to provide any history, so history obtained from family. Yesterday she seemed more fatigued than normal. She also had a cough. This morning at 6 am she was noted to have a fever to 104, she was given Tylenol. On recheck at 9 am she was altered so EMS was called. She was noted to be hypoxic, so started on NRB. On arrival she is unresponsive, not following commands, has increased WOB. No history of vomiting, diarrhea, or syncope.    Past Medical History  Diagnosis Date  . Hypertension   . High cholesterol     "took her off Lipitor ~ 6 months ago" (08/26/2013)  . Dementia    Past Surgical History  Procedure Laterality Date  . Total abdominal hysterectomy  1960's  . Cataract extraction Right ~ 2012   History reviewed. No pertinent family history. History  Substance Use Topics  . Smoking status: Former Smoker -- 0.50 packs/day    Types: Cigarettes  . Smokeless tobacco: Never Used     Comment: 08/26/2013 "social smoker; never inhaled; quit >88yr ago"  . Alcohol Use: No   OB History   Grav Para Term Preterm Abortions TAB SAB Ect Mult Living                 Review of Systems  Unable to perform ROS: Mental status change    Allergies  Review of patient's allergies indicates no known allergies.  Home Medications   Current Outpatient Rx  Name  Route  Sig  Dispense  Refill  . aspirin 81 MG chewable tablet   Oral   Chew 1 tablet (81 mg total) by mouth daily.   30 tablet   0   . Multiple Vitamin (MULTIVITAMIN WITH MINERALS) TABS tablet   Oral   Take 1 tablet by mouth daily.         . QUEtiapine (SEROQUEL) 12.5 mg TABS tablet   Oral   Take  0.5 tablets (12.5 mg total) by mouth 2 (two) times daily.   30 tablet   1    BP 85/32  Pulse 30  Temp(Src) 103.1 F (39.5 C) (Rectal)  Resp 26  SpO2 100% Physical Exam  Nursing note and vitals reviewed. Constitutional: She is uncooperative. She appears toxic. She appears distressed (respiratory distress).  Elderly female, frail appearing, sitting up in bed, unresponsive, increased WOB,   HENT:  Head: Normocephalic and atraumatic.  Mouth/Throat: Mucous membranes are dry.  Eyes: Pupils are equal, round, and reactive to light. Right conjunctiva is injected (R conjunctiva injected).  Neck: No rigidity.  Cardiovascular: Normal heart sounds and intact distal pulses.  An irregularly irregular rhythm present. Tachycardia present.  Exam reveals decreased pulses (1+ in all extremities).   Pulmonary/Chest: Accessory muscle usage present. Tachypnea noted. She is in respiratory distress. She has decreased breath sounds (bilateral bases).  Abdominal: Soft. Bowel sounds are decreased. There is no tenderness. There is no rebound and no guarding.  Musculoskeletal: She exhibits no edema.  Neurological: She is unresponsive. GCS eye subscore is 1. GCS verbal subscore is 1. GCS motor subscore is  1.  Skin: Skin is dry. No rash noted.  Skin cool and dry. Facial and extremity cyanosis on arrival.     ED Course  Procedures (including critical care time) Labs Review Labs Reviewed  CBC WITH DIFFERENTIAL - Abnormal; Notable for the following:    Monocytes Relative 14 (*)    All other components within normal limits  COMPREHENSIVE METABOLIC PANEL - Abnormal; Notable for the following:    CO2 14 (*)    Glucose, Bld 133 (*)    BUN 35 (*)    Creatinine, Ser 2.05 (*)    Albumin 2.9 (*)    AST 39 (*)    GFR calc non Af Amer 21 (*)    GFR calc Af Amer 24 (*)    All other components within normal limits  URINALYSIS, ROUTINE W REFLEX MICROSCOPIC - Abnormal; Notable for the following:    Bilirubin Urine  SMALL (*)    Ketones, ur 15 (*)    All other components within normal limits  LACTIC ACID, PLASMA - Abnormal; Notable for the following:    Lactic Acid, Venous 11.1 (*)    All other components within normal limits  CK - Abnormal; Notable for the following:    Total CK 217 (*)    All other components within normal limits  CULTURE, BLOOD (ROUTINE X 2)  CULTURE, BLOOD (ROUTINE X 2)  INFLUENZA VIRUS AG, A+B (DFA)   Imaging Review Dg Chest Portable 1 View  09/15/2013   CLINICAL DATA:  Short of breath  EXAM: PORTABLE CHEST - 1 VIEW  COMPARISON:  None.  FINDINGS: The heart size and mediastinal contours are stable. There patchy consolidation of the lateral right mid lung and right lung base. Patchy consolidation of left lung base is identified. There is chronic elevation of the right hemidiaphragm. The visualized skeletal structures are stable.  IMPRESSION: Bilateral pneumonias.   Electronically Signed   By: Sherian ReinWei-Chen  Lin M.D.   On: 09/11/2013 09:09    EKG Interpretation    Date/Time:  Monday September 16 2013 08:44:23 EST Ventricular Rate:  131 PR Interval:    QRS Duration: 92 QT Interval:  344 QTC Calculation: 508 R Axis:   91 Text Interpretation:  Atrial fibrillation Right axis deviation Borderline low voltage, extremity leads Repolarization abnormality, prob rate related Prolonged QT interval Confirmed by Freida BusmanALLEN  MD, ANTHONY ([id number]) on 09/15/2013 9:20:22 AM            MDM  80 yo F with history of HTN, HLD and dementia who lives in a SNF, presents with acute respiratory failure and fever. Placed on Bilevel on arrival given persistent hypoxia on NRB (sats 78% on NRB) and increased WOB. BP 61/37, IV access obtained and fluids started. CXR shows bilateral PNA. Dr. Freida BusmanAllen spoke with the patient's family and they wished for her to be DNR/DNI. She has a living will to this effect as well. Felt this was reasonable given her critical illness and dementia at baseline. Labs c/w severe septic shock  given lactic acid of 11. Her WBC was normal at 4.7, Hgb 13.8. Labs also show AKI as Cr is 2, baseline around 0.6, BUN elevated. CK only mildly elevated to 217.   After receiving two liters of fluid her MAP remained <60, initiated Levo. She was given Vanc and Zosyn for HCAP. Rectal tylenol for fever. Her HR did improved from 130 to 110's. Critical care consulted for further management given her critical illness. They also spoke with the patient's family,  they reiterated her wishes for DNR/DNI. Patient's family was updated on her critical illness and poor prognosis. Admitted to ICU.   Reviewed imaging, labs and previous medical records, utilized in MDM  Discussed case with Dr. Freida Busman.   Clinical Impression 1. MSOF 2. Acute hypoxic respiratory failure 3. Septic shock secondary to HCAP 4. Acute kidney injury 5. Lactic acidosis     Margie Billet, MD 10-04-13 [id number]

## 2013-09-29 DEATH — deceased

## 2015-01-23 IMAGING — CR DG HIP COMPLETE 2+V*R*
3 series · 3 of 3 positions shown · non-contrast
Comparison: None.

CLINICAL DATA: Patient fell.  Right hip pain.

EXAM:
RIGHT HIP - COMPLETE 2+ VIEW

[t pelvis a.p.]
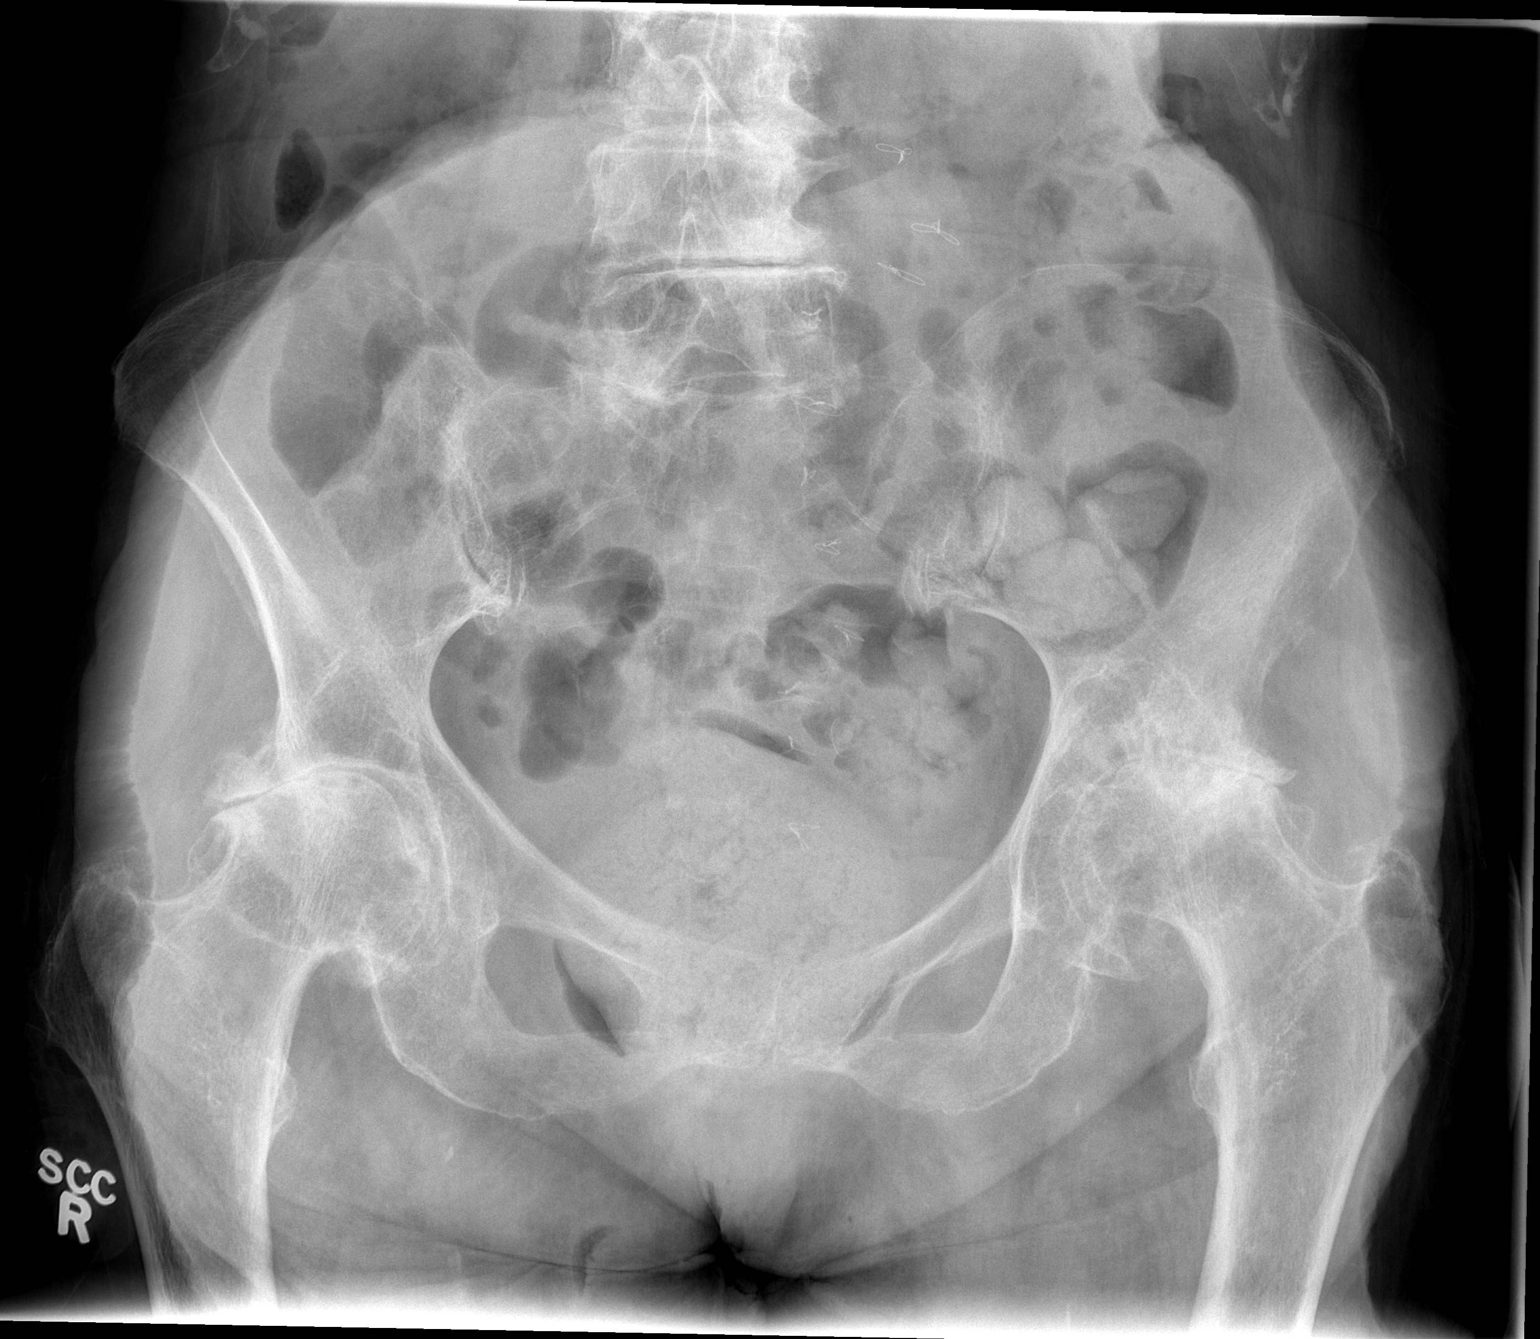

[t hip ap right]
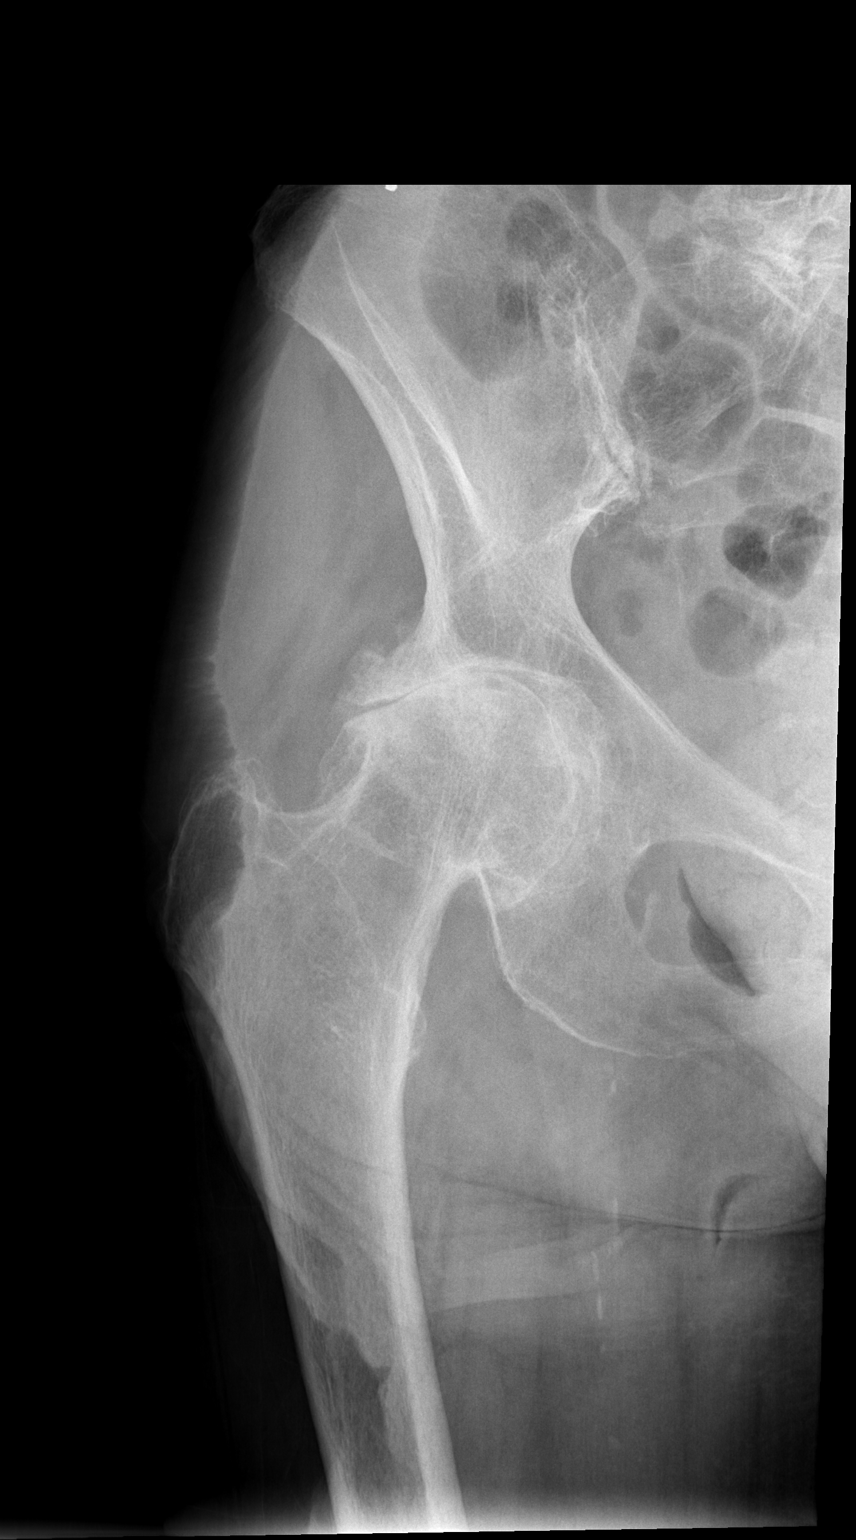

[t hip frog leg right]
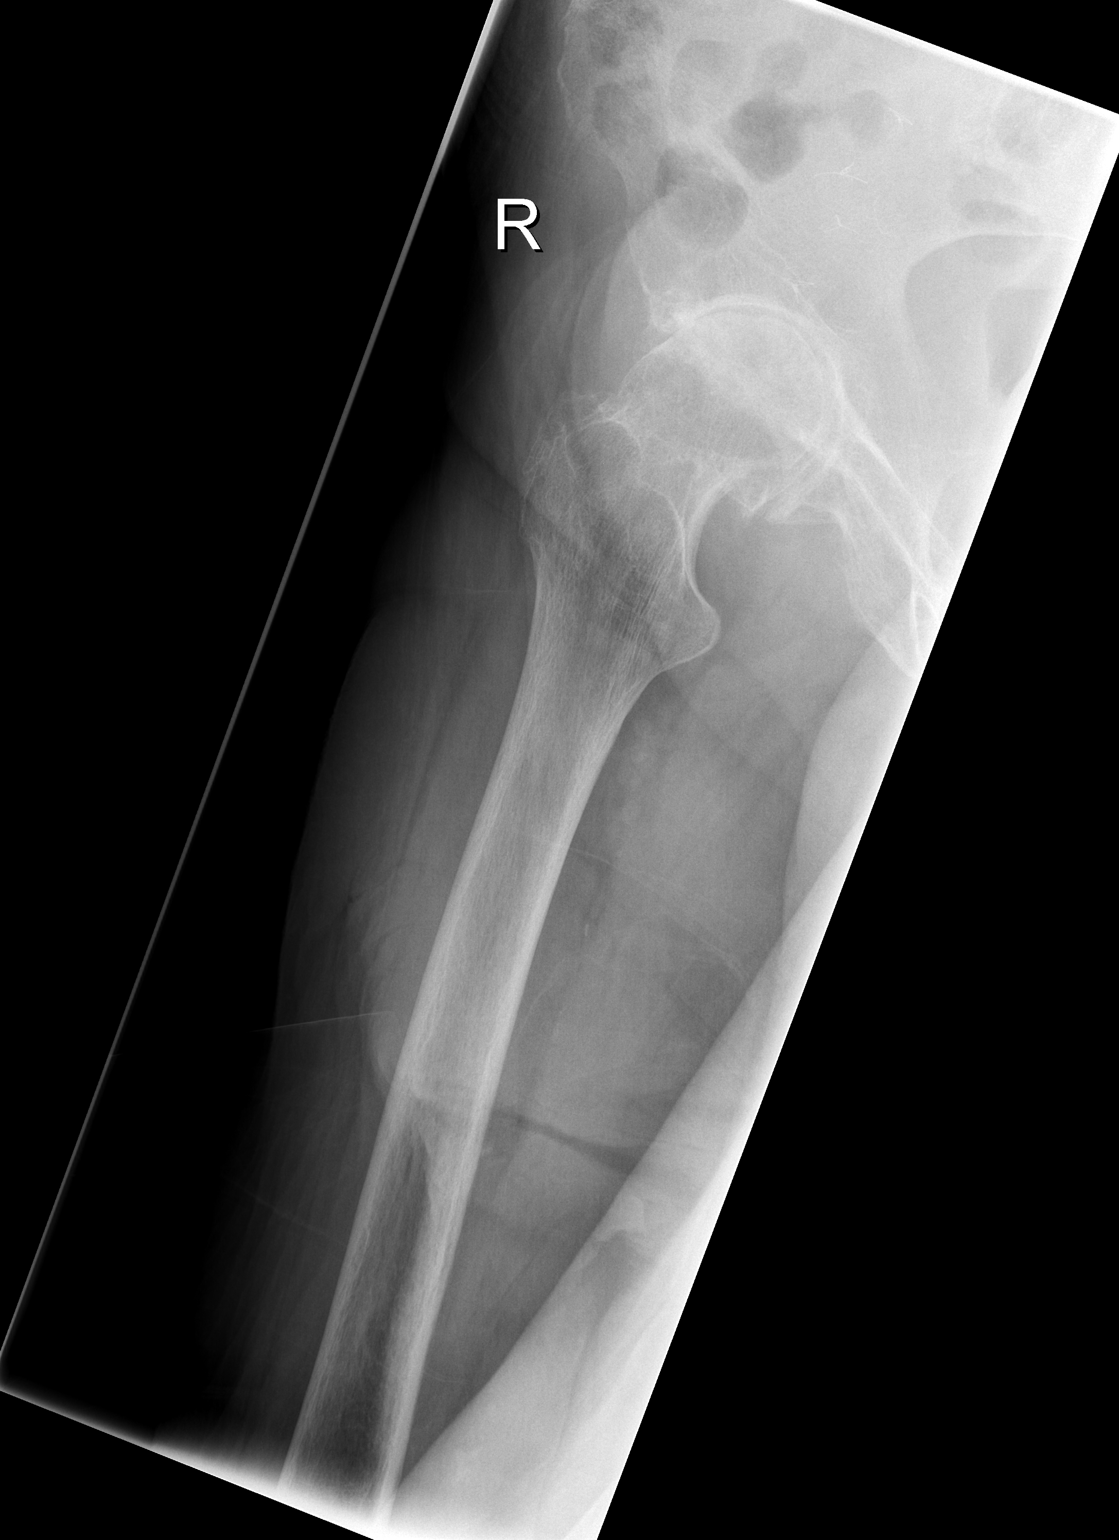

[3 of 3 positions shown; findings below may reference images not displayed]

FINDINGS: [REDACTED] degenerative changes in both hips, greater on the right. There
is sclerosis, subcortical cyst formation, loss of joint space, and
hypertrophic changes on both sides of the joints bilaterally.
Deformity of the superior and lateral femoral heads bilaterally.
Changes probably represent marked osteoarthritis but superimposed
avascular necrosis is not excluded. No evidence of acute fracture or
dislocation of the pelvis or hips. Degenerative changes in the lower
lumbar spine. Postoperative changes.
IMPRESSION: [REDACTED] degenerative changes in the hips. No acute displaced
fractures.

## 2015-01-23 IMAGING — CR DG CHEST 1V PORT
1 series · 1 of 1 positions shown · non-contrast
Comparison: 03/01/2011

CLINICAL DATA: Crackles and dry cough.

EXAM:
PORTABLE CHEST - 1 VIEW

[AP]
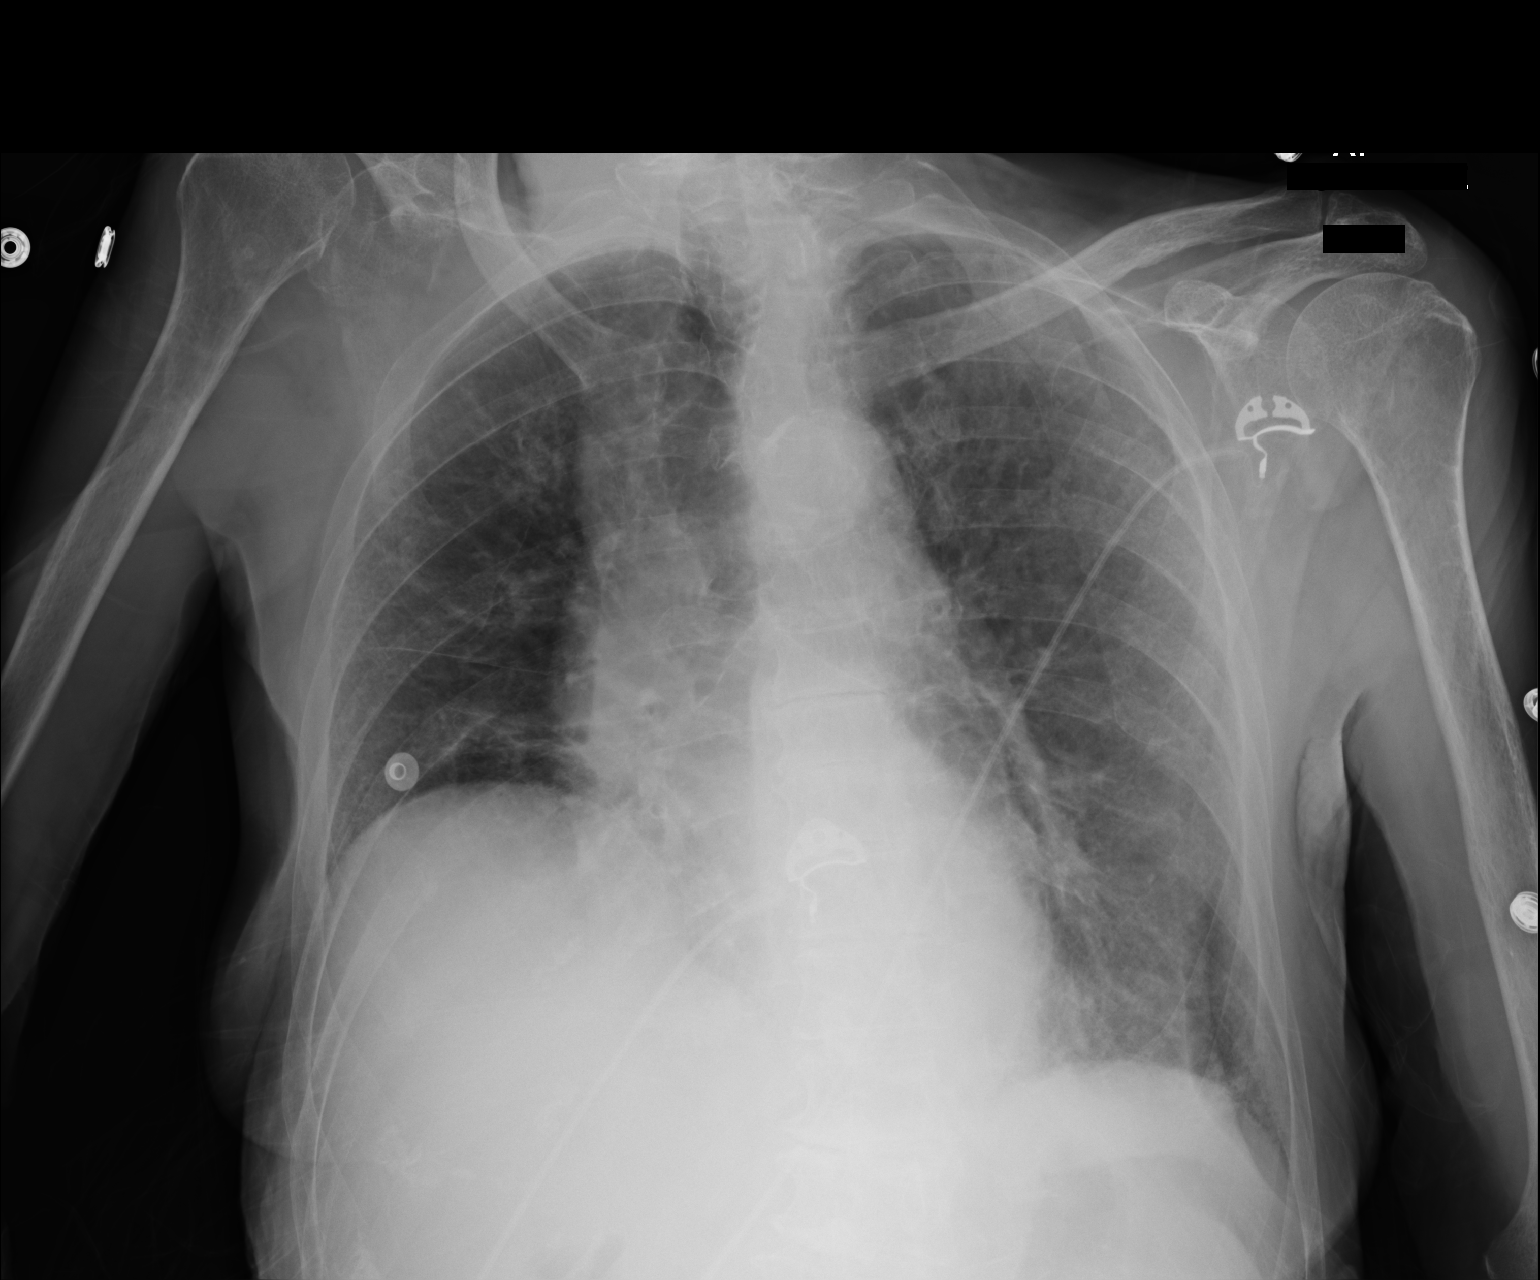

[1 of 1 positions shown; findings below may reference images not displayed]

FINDINGS: Shallow inspiration with elevation of the right hemidiaphragm. No
focal consolidation in the lungs. Normal heart size and pulmonary
vascularity. Peribronchial thickening and diffuse fibrosis in the
lungs consistent with chronic bronchitic changes. No blunting of
costophrenic angles. No pneumothorax. Similar appearance to previous
study.
IMPRESSION: Shallow inspiration with elevated right hemidiaphragm. Fibrosis and
chronic bronchitic changes in the lungs. No active disease. No
significant change.
